# Patient Record
Sex: Female | Born: 1975 | Hispanic: Yes | Marital: Married | Smoking: Former smoker
Health system: Southern US, Community
[De-identification: ages and names within clinical notes are randomized; demographics above are authoritative.]

## PROBLEM LIST (undated history)

## (undated) DIAGNOSIS — F32A Depression, unspecified: Secondary | ICD-10-CM

## (undated) DIAGNOSIS — R519 Headache, unspecified: Secondary | ICD-10-CM

## (undated) DIAGNOSIS — F329 Major depressive disorder, single episode, unspecified: Secondary | ICD-10-CM

## (undated) DIAGNOSIS — R51 Headache: Secondary | ICD-10-CM

## (undated) DIAGNOSIS — D649 Anemia, unspecified: Secondary | ICD-10-CM

## (undated) DIAGNOSIS — I10 Essential (primary) hypertension: Secondary | ICD-10-CM

## (undated) DIAGNOSIS — N200 Calculus of kidney: Secondary | ICD-10-CM

## (undated) HISTORY — DX: Essential (primary) hypertension: I10

## (undated) HISTORY — PX: APPENDECTOMY: SHX54

## (undated) HISTORY — PX: OVARIAN CYST REMOVAL: SHX89

## (undated) HISTORY — DX: Calculus of kidney: N20.0

---

## 2008-08-01 ENCOUNTER — Ambulatory Visit: Payer: Self-pay | Admitting: Obstetrics & Gynecology

## 2008-10-04 ENCOUNTER — Ambulatory Visit: Payer: Self-pay | Admitting: Obstetrics & Gynecology

## 2008-10-04 ENCOUNTER — Encounter: Payer: Self-pay | Admitting: Obstetrics & Gynecology

## 2008-10-04 ENCOUNTER — Observation Stay (HOSPITAL_COMMUNITY): Admission: RE | Admit: 2008-10-04 | Discharge: 2008-10-05 | Payer: Self-pay | Admitting: Obstetrics & Gynecology

## 2008-11-15 ENCOUNTER — Ambulatory Visit: Payer: Self-pay | Admitting: Obstetrics and Gynecology

## 2010-04-10 ENCOUNTER — Ambulatory Visit: Payer: Self-pay | Admitting: Obstetrics & Gynecology

## 2010-04-28 ENCOUNTER — Ambulatory Visit (HOSPITAL_COMMUNITY)
Admission: RE | Admit: 2010-04-28 | Discharge: 2010-04-28 | Payer: Self-pay | Source: Home / Self Care | Admitting: Family Medicine

## 2010-05-01 ENCOUNTER — Ambulatory Visit: Payer: Self-pay | Admitting: Obstetrics and Gynecology

## 2010-06-14 ENCOUNTER — Encounter: Payer: Self-pay | Admitting: *Deleted

## 2010-07-31 ENCOUNTER — Ambulatory Visit: Payer: Self-pay | Admitting: Physician Assistant

## 2010-08-22 ENCOUNTER — Ambulatory Visit (INDEPENDENT_AMBULATORY_CARE_PROVIDER_SITE_OTHER): Payer: Self-pay | Admitting: Physician Assistant

## 2010-08-22 DIAGNOSIS — N83209 Unspecified ovarian cyst, unspecified side: Secondary | ICD-10-CM

## 2010-08-23 NOTE — Progress Notes (Signed)
Catherine Ingram, NATT          ACCOUNT NO.:  [id number]  MEDICAL RECORD NO.:  [id number]           PATIENT TYPE:  A  LOCATION:  [facility]                   FACILITY:  [facility]  PHYSICIAN:  Maylon Cos, CNM    DATE OF BIRTH:  [birth year]  DATE OF SERVICE:  08/22/2010                                 CLINIC NOTE  The patient returns today for a 42-month followup for resolution of a complex right ovarian cyst that has previously been treated with 2 months of oral contraceptive pills.  HISTORY OF PRESENT ILLNESS:  The patient was seen on May 01, 2010, by Dr. Okey Dupre.  She is 35 year old Spanish speaking Hispanic female who is a gravida 2, para 2-0-0-2 who is desiring more children.  She underwent bilateral cystectomy on Oct 04, 2008, by Dr. Marice Potter secondary to bilateral dermoid cyst.  Subsequently, she developed a complex right cyst on her right ovary that was probably hemorrhagic in nature and returns today for followup.  The patient states that she is having no pain and is having regular menstrual cycle.  She took 2 months of oral contraceptive pills, has had regular periods since discontinuing those, is actually trying to get pregnant and has still not conceived.  PHYSICAL EXAMINATION:  GENERAL:  Today, Senaida is a pleasant Hispanic female in no apparent distress. HEENT:  Grossly normal. ABDOMEN:  Soft and nontender with no hepatosplenomegaly. PELVIC:  Nonenlarged, nontender uterus with no cervical motion tenderness and nonenlarged, nontender adnexa bilaterally.  ASSESSMENT: 1. Resolution of ovarian cyst. 2. Secondary infertility.  PLAN: 1. I have discussed in brief today charting her periods and sexual     intercourse and following up with this information in our     infertility clinic, payment option and cost of these appointments     has been discussed with her. 2. Per Dr. Serita Kyle note at her earlier appointments, the possibility of     a hysterosalpingogram has also been  reviewed; however, the patient     was unaware of the cost of this, again that has been shared with     600 dollar cost and the patient is unsure of her interest in this     procedure.  Furthermore, semen analysis has not been done of her     partner, that is also recommended and cost of that has been     discussed as well. 3. Followup.  The patient should follow up as needed for pain or at     our next infertility clinic for workup.  The patient is agreement     with this plan.         ______________________________ Maylon Cos, CNM   SS/MEDQ  D:  08/22/2010  T:  08/23/2010  Job:  [id number]

## 2010-09-02 LAB — CBC
Hemoglobin: 11.7 g/dL — ABNORMAL LOW (ref 12.0–15.0)
MCHC: 34.6 g/dL (ref 30.0–36.0)
MCHC: 34.9 g/dL (ref 30.0–36.0)
MCV: 92.2 fL (ref 78.0–100.0)
MCV: 92.8 fL (ref 78.0–100.0)
Platelets: 200 10*3/uL (ref 150–400)
RBC: 3.6 MIL/uL — ABNORMAL LOW (ref 3.87–5.11)
RBC: 4.13 MIL/uL (ref 3.87–5.11)

## 2010-09-02 LAB — URINALYSIS, ROUTINE W REFLEX MICROSCOPIC
Glucose, UA: NEGATIVE mg/dL
Ketones, ur: NEGATIVE mg/dL
Leukocytes, UA: NEGATIVE
Protein, ur: NEGATIVE mg/dL

## 2010-09-02 LAB — URINE MICROSCOPIC-ADD ON

## 2010-10-07 NOTE — Op Note (Signed)
NAMETISHARA, Catherine Ingram          ACCOUNT NO.:  [id number]   MEDICAL RECORD NO.:  [id number]          PATIENT TYPE:  OBV   LOCATION:  9320                          [facility]   PHYSICIAN:  Catherine Bossier, MD        DATE OF BIRTH:  01/15/1976   DATE OF PROCEDURE:  10/04/2008  DATE OF DISCHARGE:                               OPERATIVE REPORT   PREOPERATIVE DIAGNOSIS:  Bilateral dermoid tumors.   POSTOPERATIVE DIAGNOSIS:  Bilateral dermoid tumors.   PROCEDURE:  Laparoscopy followed by exploratory laparotomy and bilateral  ovarian cystectomy.   SURGEON:  Catherine C. Marice Potter, MD   ASSISTANT:  Catherine Dukes, MD.   ANESTHESIA:  GETA.  Catherine Ingram, M.D.   COMPLICATIONS:  None.   ESTIMATED BLOOD LOSS:  Minimal.   SPECIMENS:  Bilateral ovarian tumors.   DETAILS OF PROCEDURE AND FINDINGS:  Preoperatively explained to Catherine Ingram  that I would try to remove the dermoid through the laparoscope.  She was  very much worried that she would lose both her ovaries entirely.  She  does want to have more children.  I explained to her that was a  possibility and that the other possibility was that I would have to do a  laparotomy to remove either the ovaries or the tumors.  The risks,  benefits, and alternatives of surgery were explained understood and  accepted.  Consents were signed.   DESCRIPTION OF PROCEDURE:  She was taken to the operating room and  general anesthesia was applied without complication after she was in the  dorsal lithotomy position.  Her abdomen was prepped and draped usual  sterile fashion.  A Foley catheter was placed, which drained clear urine  throughout.  A bimanual exam revealed a retroverted normal size and  shape uterus and bilateral adnexal masses.  A Hulka manipulator was  placed.  Gloves were changed.  Attention was turned to the abdomen.  A  vertical umbilical incision was made.  A Veress needle was placed  intraperitoneally, low-flow CO2 was used to insufflate  the abdomen,  approximately 5 L.  After a good pneumoperitoneum was established a 10-  mm trocar was placed.  Laparoscopy was performed.  She was placed in  Trendelenburg position.  The pelvis was inspected in alpha manner.  The  right ovary was approximately 8 cm in diameter and the left was about 4.  Based on the size of that, I decided to proceed with a laparotomy.  The  CO2 was allowed to escape from the abdomen and we proceeded to make a  small transverse incision approximately 1 cm above the symphysis pubis.  Incision was carried down through the subcutaneous tissue and the  fascia.  Fascia was scored in the midline.  Fascial incision was  extended bilaterally and curved slightly upwards.  The pyramidalis  muscles were separated in midline using electrosurgical technique and  the inner 25% of each of the rectus muscles were separated in transverse  fashion using electrosurgical technique.  The peritoneum was elevated  with hemostats and incised with curved Metzenbaum scissors.  The left  ovary  was taken out of the abdomen gently and the incision was lined  with moist green towels.  A nick was made in the cortex of the uterus  using a cautery and then the dermoid was shelled out.  Excellent  hemostasis was maintained throughout and there was no spillage.  The  remainder of the ovarian cortex was oversewn together in a running  locking fashion.  Excellent hemostasis was noted and it was wrapped in  Interceed to help prevent future adhesions.  The same procedure was  performed on the right side ovary.  Again minimal bleeding was noted.  The specimen was removed and noted to be intact and the remainder of the  ovarian cortex was closed with a running locking 2-0 Vicryl suture.  It  was wrapped in the Interceed and allowed to fall back in abdominal  cavity.  There was a minimal amount blood loss.  The rectus muscles were  inspected noted to be hemostatic.  The fascia was closed with the  0  Vicryl running nonlocking suture.  No defects were palpable.  The  subcutaneous tissue was irrigated, cleaned and dried.  It was then  infiltrated with 30 mL of 0.5% Marcaine.  A subcuticular closure was  done with 3-0 Vicryl suture.  Instruments, sponge, needle and counts  were correct and Foley catheter drained clear urine throughout.  She was  taken to recovery room in stable condition.      Catherine Bossier, MD  Electronically Signed     MCD/MEDQ  D:  10/04/2008  T:  10/05/2008  Job:  [id number]

## 2010-10-07 NOTE — Discharge Summary (Signed)
Catherine Ingram, Catherine Ingram          ACCOUNT NO.:  [id number]   MEDICAL RECORD NO.:  [id number]          PATIENT TYPE:  OBV   LOCATION:  9320                          [facility]   PHYSICIAN:  Allie Bossier, MD        DATE OF BIRTH:  [birth year]   DATE OF ADMISSION:  10/04/2008  DATE OF DISCHARGE:  10/05/2008                               DISCHARGE SUMMARY   ADMISSION DIAGNOSIS:  Bilateral dermoid cyst.   DISCHARGE DIAGNOSES:  1. Bilateral dermoid cyst.  2. Anemia.   PROCEDURE:  An uncomplicated laparoscopy followed by a laparotomy with  bilateral ovarian cystectomy.   CONDITION:  Stable.   DISPOSITION:  Home.   DIET:  As tolerated.   ACTIVITY:  She is not to drive while taking narcotics.  She is not to  have intercourse for 3 weeks and I have recommended that she can  postpone pregnancy for 3 months.   NEW DISCHARGE MEDICATIONS:  Percocet 325 mg/5 mg one p.o. q.4 h p.r.n.  pain #30 no refills.  Her home medication is lisinopril 25 mg daily.   FOLLOWUP:  She will follow up in 4-6 weeks or as necessary sooner at the  clinic.  She is given the number and will call.   BRIEF HISTORY OF PRESENT ILLNESS AND HOSPITAL COURSE:  Catherine Ingram is a 35-  year-old, married, Hispanic, gravida 2, para 2, who was diagnosed  bilateral dermoid cyst in November of last year via a CT scan.  She was  scheduled for surgery, but this is the first time that she did not have  her surgery until now.  On initial laparoscopy, the dermoid on the right  had grown to a size that would make it impossible to do the surgery  laparoscopically, therefore, I proceeded with a mini laparotomy and  removed both dermoids on each side and left ovarian tissue per her  request on each side.  She was hemostatic.  When we left, her postop  hemoglobin was 11.7  and her admission hemoglobin was 13.2.  On the day of discharge, she was  voiding, ambulating, tolerating p.o. without nausea, vomiting, or other  problems.  She was  ready to go home and all questions were answered.  She will follow up as above.       Allie Bossier, MD  Electronically Signed     MCD/MEDQ  D:  10/05/2008  T:  10/05/2008  Job:  [id number]

## 2010-10-07 NOTE — Group Therapy Note (Signed)
Catherine Ingram, Catherine Ingram          ACCOUNT NO.:  [id number]   MEDICAL RECORD NO.:  [id number]          PATIENT TYPE:  WOC   LOCATION:  [facility]                   [facility]AC[facility]  [facility]   PHYSICIAN:  Wynelle Bourgeois, CNM    DATE OF BIRTH:  11-Jun-1975   DATE OF SERVICE:  11/15/2008                                  CLINIC NOTE   SUBJECTIVE:  This is a 35 year old single Hispanic, gravida 2, para 2  who was referred from [facility] for bilateral ovarian  dermoid cysts.  These were removed by Dr. Nicholaus Bloom on Oct 04, 2008, by  an uncomplicated laparoscopy followed by a laparotomy with bilateral  ovarian cystectomy.  Pathology showed bilateral mature cystic teratomas  with no immature or malignant features on either one.  They have  appropriate features for dermoid cysts and no abnormal features.  She  was discharged home the next day and has done well at home.  Her last  pain medicine was about a week ago.  She has very little pain at  present.  She has had no abnormal bleeding or fevers.  She was passing  her urine and bowels normally.  She plans to continue her GYN care at  [facility] Department.   OBJECTIVE:  VITAL SIGNS:  Stable.  Blood pressure is 101/73, temperature  is 97.5, and weight is 159.2.  ABDOMEN:  Soft and nontender.  Low transverse incision is well healed  with Steri-Strips, which were removed.  Vaginal bleeding, none.  EXTREMITIES:  Within normal limits.   ASSESSMENT:  Status post laparotomy for excision of bilateral dermoid  cysts by Dr. Marice Potter 6 weeks ago.   PLAN:  Discharged from our care back to the [facility]  Department.  The patient declines contraception and wishes to attempt  pregnancy within the next few months.  She was advised to wait for 3  months before attempting pregnancy.  She is advised that she can resume  her normal activities unless she has pain with them in which case, she  should diminish her activity until she does not  have pain.  She plans to  have her Pap in December at [facility] Department and will  contact us only if needed.           ______________________________  Wynelle Bourgeois, CNM     MW/MEDQ  D:  11/15/2008  T:  11/16/2008  Job:  [id number]

## 2010-10-07 NOTE — Group Therapy Note (Signed)
NAMERENAYE, JANICKI NO.:  [id number]   MEDICAL RECORD NO.:  [id number]          PATIENT TYPE:  WOC   LOCATION:  [facility]                   [facility]   PHYSICIAN:  Allie Bossier, MD        DATE OF BIRTH:  1975/12/21   DATE OF SERVICE:                                  CLINIC NOTE   Ms. Flurry is a 35 year old single Hispanic gravida 2, para 2.  She  has 9 and 59-year-old daughters.  She was referred here from the [facility] Department after a CT scan done for right-sided pelvic pain  done on March 02, 2008, showed bilateral ovarian dermoid.  She had the  CT scan done initially for right lower quadrant pain and which showed a  5-mm kidney stone.  She actually passed a kidney stone and feels better.  However, follow up on the dermoids is needed, one measured 5.3 x 3.6 cm  on the left and the right one measured 5.6 x 6 cm.  She would like to  have another child sometime in the future.  She currently takes oral  contraceptive pills for birth control and has been very happy with this  method.   PAST MEDICAL HISTORY:  Hypertension, kidney stones, and bilateral  dermoids.   PAST SURGICAL HISTORY:  None.   FAMILY HISTORY:  Negative for breast, GYN, and colon malignancies.   SOCIAL HISTORY:  Negative for tobacco, alcohol, or drug use.   ALLERGIES:  No known drug allergies.  No latex allergies.   REVIEW OF SYSTEMS:  Her last Pap smear was done in December 2009 and it  was normal per her.  It was done at [facility].  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   MEDICATION LIST:  Lisinopril 25 mg daily or contraceptive pills (she  will call with the specific brand name), multivitamin daily, and  ibuprofen p.r.n.   PHYSICAL EXAMINATION:  VITAL SIGNS:  Weight 166 pounds, blood pressure  113/70, pulse 57, height 5 feet 6 inches, weight 166 pounds.  HEENT:  Normal.  HEART:  Regular rate and rhythm.  LUNGS:  Clear to auscultation bilaterally.  ABDOMEN:  Benign.  PELVIC:  There are palpable adnexal masses, minimally tender.   ASSESSMENT AND PLAN:  Bilateral adnexal masses consistent with dermoids.  I have had a lengthy discussion with the assistance of [place] as the  interpreter.  We have discussed that there is a low potential for  malignancy with these but that we do recommend surgical removal.  We  established that she would prefer a laparoscopic procedure over an open  procedure, and I am in agreement with this.  I have also discussed with  her that I will try to leave any unaffected ovarian tissue that I can  but it is possible that at the completion of surgery, her entire ovaries  on both sides may have been removed.  She understands this and she  understands that if this happens that means no more children from her  eggs.  Risks of surgery explained has been accepted.      Allie Bossier, MD     MCD/MEDQ  D:  08/01/2008  T:  08/02/2008  Job:  [id number]

## 2013-01-19 ENCOUNTER — Encounter: Payer: Self-pay | Admitting: Obstetrics & Gynecology

## 2013-01-19 ENCOUNTER — Ambulatory Visit (INDEPENDENT_AMBULATORY_CARE_PROVIDER_SITE_OTHER): Payer: Self-pay | Admitting: Obstetrics & Gynecology

## 2013-01-19 VITALS — BP 125/87 | HR 60 | Temp 97.8°F | Ht 66.0 in | Wt 176.7 lb

## 2013-01-19 DIAGNOSIS — N949 Unspecified condition associated with female genital organs and menstrual cycle: Secondary | ICD-10-CM

## 2013-01-19 DIAGNOSIS — R102 Pelvic and perineal pain: Secondary | ICD-10-CM

## 2013-01-19 DIAGNOSIS — Z86018 Personal history of other benign neoplasm: Secondary | ICD-10-CM | POA: Insufficient documentation

## 2013-01-19 LAB — POCT URINALYSIS DIP (DEVICE)
Bilirubin Urine: NEGATIVE
Nitrite: NEGATIVE
pH: 6 (ref 5.0–8.0)

## 2013-01-19 NOTE — Progress Notes (Signed)
Patient ID: Catherine Ingram, female   DOB: Mar 08, 1976, 37 y.o.   MRN: [id number]  Chief Complaint  Patient presents with  . ovarian cysts    patient wants to be checked for cysts  . Pelvic Pain    4 months; gets period sometimes 3 times in one month    HPI Catherine Ingram is a 37 y.o. female.  [id number] Patient's last menstrual period was 01/06/2013.   BTB on Micron[employer] Rx by HD in [facility] due to her HTN. Pelvic pain on right side for 3-4 months, dyspareunia HPI  Past Medical History  Diagnosis Date  . Hypertension   . Kidney stones     Past Surgical History  Procedure Laterality Date  . Ovarian cyst removal    laparotomy, bilateral 2010 Dr. Marice Potter  Family History  Problem Relation Age of Onset  . Hypertension Mother   . Diabetes Mother   . Hypertension Father   . Stroke Father     Social History History  Substance Use Topics  . Smoking status: Never Smoker   . Smokeless tobacco: Never Used  . Alcohol Use: No    No Known Allergies  Current Outpatient Prescriptions  Medication Sig Dispense Refill  . amLODipine (NORVASC) 10 MG tablet Take 10 mg by mouth daily.      . norethindrone (MICRONOR,CAMILA,ERRIN) 0.35 MG tablet Take 1 tablet by mouth daily.       No current facility-administered medications for this visit.    Review of Systems Review of Systems  Constitutional: Negative for fever.  Genitourinary: Positive for dysuria, frequency, menstrual problem, pelvic pain and dyspareunia. Negative for vaginal bleeding, vaginal discharge and vaginal pain.    Blood pressure 125/87, pulse 60, temperature 97.8 F (36.6 C), temperature source Oral, height 5\' 6"  (1.676 m), weight 176 lb 11.2 oz (80.151 kg), last menstrual period 01/06/2013.  Physical Exam Physical Exam  Nursing note and vitals reviewed. Constitutional: She is oriented to person, place, and time. She appears well-developed and well-nourished. No distress.  Pulmonary/Chest: Effort normal.   Abdominal: Soft. She exhibits no distension and no mass. There is no tenderness.  Genitourinary: Vagina normal and uterus normal. No vaginal discharge (GC and CT sent) found.  No mass, mild s/p and right side tenderness  Neurological: She is alert and oriented to person, place, and time.  Skin: Skin is warm and dry.  Psychiatric: She has a normal mood and affect. Her behavior is normal.    Data Reviewed Op report  Assessment    Pelvic pain, BTB     Plan    Loestrin 24 RTC check BP Pelvic US Urine culture        ARNOLD,JAMES 01/19/2013, 4:25 PM

## 2013-01-19 NOTE — Patient Instructions (Addendum)
Dolor pélvico en la mujer   (Pelvic Pain, Female)   Las causa del dolor pélvico en la mujer pueden ser muchas y pueden tener su origen en diferentes lugares. El dolor pélvico es el que aparece en la mitad inferior del abdomen y entre las caderas. Puede aparecer durante en un período corto de tiempo (agudo)o puede ser recurrente (crónico). Esta afección puede estar relacionada con trastornos que afectan a los órganos reproductivos femeninos (ginecológica), pero también puede deberse a problemas en la vejiga, cálculos renales, complicaciones intestinales, o problemas musculares o esqueléticos. Es importante solicitar ayuda de inmediato, sobre todo si ha sido intenso, agudo, o ha aparecido de manera súbita como un dolor inusual. También es importante obtener ayuda de inmediato, ya que algunos tipos de dolor pélvico puede poner en peligro la vida.   CAUSAS   A continuación veremos algunas de las causas del dolor pélvico. Las causas pueden clasificarse de diferentes modos.   · Ginecológica.  · Enfermedad inflamatoria pélvica.  · Infecciones de transmisión sexual.  · Quiste de ovario o torsión de un ligamento ovárico ( torsión ovárica).  · La membrana que recubre internamente al útero desarrollándose fuera del útero (endometriosis).  · Fibromas, quistes o tumores.  · Ovulación.  · Embarazo.  · Embarazo fuera del útero (embarazo ectópico).  · Aborto espontáneo.  · Trabajo de parto.  · Desprendimiento de la placenta o ruptura del útero.  · Infecciones.  · Infección uterina (endometritis).  · Infección de la vejiga.  · Diverticulitis.  · Aborto relacionado con una infección uterina (aborto séptico).  · Vejiga.  · Inflamación de la vejiga (cistitis).  · Cálculos renales.  · Gastrointenstinal.  · Estreñimiento.  · Diverticulitis.  · Neurológico.  · Traumatismos.  · Sentir dolor pélvico debido a causas mentales o emocionales (psicosomático).  · Tumores en el intestino o en la pelvis.  EVALUACIÓN   El médico hará una historia  clínica detallada según sus síntomas. Incluirá los cambios recientes en su salud, una cuidadosa historia ginecológica de sus periodos (menstruaciones) y una historia de su actividad sexual. Los antecedentes familiares y la historia clínica también son importantes. Su médico podrá indicar un examen pélvico. El examen pélvico ayudará a identificar la ubicación y la gravedad del dolor. También ayudará a evaluar los órganos que pueden estar involucrados. . Para identificar la causa del dolor pélvico y tratarlo adecuadamente, el médico puede indicar estudios. Estas pruebas pueden ser:   · Test de embarazo.  · Ecografía pélvica.  · Radiografía del abdomen.  · Un análisis de orina o la evaluación de la secreción vaginal.  · Análisis de sangre.  INSTRUCCIONES PARA EL CUIDADO EN EL HOGAR   · Solo tome medicamentos de venta libre o recetados para el dolor, malestar o fiebre, según las indicaciones del médico.    · Haga reposo según las indicaciones del médico.    · Consuma una dieta balanceada.    · Beba gran cantidad de líquido para mantener la orina de tono claro o amarillo pálido.    · Evite las relaciones sexuales, si le producen dolor.    · Aplique compresas calientes o frías en la zona baja del abdomen según cual le calme el dolor.    · Evite las situaciones estresantes.    · Lleve un registro del dolor pélvico. Anote cuándo comenzó, dónde se localiza el dolor y si hay cosas que parecen estar asociadas con el dolor, como algún alimento o su ciclo menstrual.  · Concurra a las visitas de control con el médico, según   las indicaciones.    SOLICITE ATENCIÓN MÉDICA SI:   · Los medicamentos no le calman el dolor.  · Tiene flujo vaginal anormal.  SOLICITE ATENCIÓN MÉDICA DE INMEDIATO SI:   · Tiene un sangrado abundante por la vagina.    · El dolor pélvico aumenta.    · Se siente mareada o sufre un desmayo.    · Siente escalofríos.    · Siente dolor intenso al orinar u observa sangre en la orina.    · Tiene diarrea o vómitos que  no puede controlar.    · Tiene fiebre o síntomas que persisten durante más de 3 días.  · Tiene fiebre y los síntomas empeoran.    · Ha sido abusada física o sexualmente.    ASEGÚRESE DE QUE:   · Comprende estas instrucciones.  · Controlará su enfermedad.  · Solicitará ayuda de inmediato si no mejora o si empeora.  Document Released: 08/07/2008 Document Revised: 11/10/2011  Exit[employer]® Patient Information ©2014 [employer].

## 2013-01-21 LAB — URINE CULTURE

## 2013-01-23 HISTORY — PX: WISDOM TOOTH EXTRACTION: SHX21

## 2013-01-24 ENCOUNTER — Ambulatory Visit (HOSPITAL_COMMUNITY)
Admission: RE | Admit: 2013-01-24 | Discharge: 2013-01-24 | Disposition: A | Discharge status: Home / Self Care | Payer: No Typology Code available for payment source | Source: Ambulatory Visit | Attending: Obstetrics & Gynecology | Admitting: Obstetrics & Gynecology

## 2013-01-24 DIAGNOSIS — R102 Pelvic and perineal pain: Secondary | ICD-10-CM

## 2013-01-24 DIAGNOSIS — N949 Unspecified condition associated with female genital organs and menstrual cycle: Secondary | ICD-10-CM | POA: Insufficient documentation

## 2013-01-24 DIAGNOSIS — D251 Intramural leiomyoma of uterus: Secondary | ICD-10-CM | POA: Insufficient documentation

## 2013-01-26 ENCOUNTER — Telehealth: Payer: Self-pay | Admitting: General Practice

## 2013-01-26 DIAGNOSIS — N390 Urinary tract infection, site not specified: Secondary | ICD-10-CM

## 2013-01-26 MED ORDER — NITROFURANTOIN MONOHYD MACRO 100 MG PO CAPS: 100.0000 mg | ORAL_CAPSULE | Freq: Two times a day (BID) | ORAL | Status: DC

## 2013-01-26 NOTE — Telephone Encounter (Signed)
Message copied by Kathee Delton on Thu Jan 26, 2013 11:31 AM ------      Message from: Adam Phenix      Created: Thu Jan 26, 2013 11:21 AM       UTI, Rx Macrobid 100 mg BID 7 days ------

## 2013-01-26 NOTE — Telephone Encounter (Signed)
Called patient with Catherine Ingram for interpreter and informed her of results and medication available for pickup at her [facility] pharmacy. Patient verbalized understanding and had no further questions

## 2013-03-03 ENCOUNTER — Encounter: Payer: Self-pay | Admitting: Obstetrics & Gynecology

## 2013-03-03 ENCOUNTER — Ambulatory Visit (INDEPENDENT_AMBULATORY_CARE_PROVIDER_SITE_OTHER): Payer: No Typology Code available for payment source | Admitting: Obstetrics & Gynecology

## 2013-03-03 ENCOUNTER — Encounter: Payer: Self-pay | Admitting: *Deleted

## 2013-03-03 VITALS — BP 138/86 | HR 59 | Temp 99.5°F | Ht 65.0 in | Wt 174.4 lb

## 2013-03-03 DIAGNOSIS — R102 Pelvic and perineal pain: Secondary | ICD-10-CM

## 2013-03-03 DIAGNOSIS — Z23 Encounter for immunization: Secondary | ICD-10-CM

## 2013-03-03 DIAGNOSIS — N938 Other specified abnormal uterine and vaginal bleeding: Secondary | ICD-10-CM

## 2013-03-03 DIAGNOSIS — N949 Unspecified condition associated with female genital organs and menstrual cycle: Secondary | ICD-10-CM

## 2013-03-03 DIAGNOSIS — Z0000 Encounter for general adult medical examination without abnormal findings: Secondary | ICD-10-CM

## 2013-03-03 LAB — LIPID PANEL
Cholesterol: 197 mg/dL (ref 0–200)
Total CHOL/HDL Ratio: 2.8 Ratio

## 2013-03-03 LAB — CBC
Hemoglobin: 13.3 g/dL (ref 12.0–15.0)
RBC: 4.35 MIL/uL (ref 3.87–5.11)
WBC: 7.2 10*3/uL (ref 4.0–10.5)

## 2013-03-03 LAB — COMPREHENSIVE METABOLIC PANEL
Albumin: 4.3 g/dL (ref 3.5–5.2)
BUN: 11 mg/dL (ref 6–23)
Calcium: 9.3 mg/dL (ref 8.4–10.5)
Chloride: 104 mEq/L (ref 96–112)
Glucose, Bld: 88 mg/dL (ref 70–99)
Potassium: 4.4 mEq/L (ref 3.5–5.3)

## 2013-03-03 LAB — POCT URINALYSIS DIP (DEVICE)
Bilirubin Urine: NEGATIVE
Glucose, UA: NEGATIVE mg/dL
Specific Gravity, Urine: 1.015 (ref 1.005–1.030)

## 2013-03-03 LAB — TSH: TSH: 1.677 u[IU]/mL (ref 0.350–4.500)

## 2013-03-03 MED ORDER — NORGESTREL-ETHINYL ESTRADIOL 0.3-30 MG-MCG PO TABS: 1.0000 | ORAL_TABLET | Freq: Every day | ORAL | Status: DC

## 2013-03-03 NOTE — Progress Notes (Signed)
Patient describes ovarian pain yesterday but denies pain today.  BP elevated. Pt reports she has not taken her medication today because she has not eaten today.

## 2013-03-03 NOTE — Patient Instructions (Signed)

## 2013-03-03 NOTE — Progress Notes (Signed)
  Subjective:    Patient ID: Catherine Ingram, female    DOB: Jul 22, 1975, 37 y.o.   MRN: [id number]  HPI  37 yo SH P 2 (14 and9) here today in the gyn clinic to discuss her u/s result. She had the u/s done to evaluate pain in her pelvis, right greater than left. No relation to her period, worse with sex. Happens irregularly, about 3 times per week, lasts for hours, but it is mild. She has tried no medications for this pain. She takes OCPs but she has some irregular bleeding.  Review of Systems Last pap 2011    Objective:   Physical Exam        Assessment & Plan:  Irregular bleeding- check TSH and cervical cultures Pelvic pain- IBU prescription given Change her OCPs to Lo/Ovral RTC 2 months for follow up and annual I will check screening labs today Flu vaccine today

## 2013-03-04 LAB — GC/CHLAMYDIA PROBE AMP: GC Probe RNA: NEGATIVE

## 2013-05-01 ENCOUNTER — Ambulatory Visit: Payer: No Typology Code available for payment source | Admitting: Obstetrics & Gynecology

## 2013-06-21 ENCOUNTER — Encounter: Payer: Self-pay | Admitting: Obstetrics & Gynecology

## 2013-06-21 ENCOUNTER — Ambulatory Visit (INDEPENDENT_AMBULATORY_CARE_PROVIDER_SITE_OTHER): Payer: No Typology Code available for payment source | Admitting: Obstetrics & Gynecology

## 2013-06-21 VITALS — BP 131/86 | HR 61 | Temp 98.1°F | Resp 20 | Ht 66.0 in | Wt 180.9 lb

## 2013-06-21 DIAGNOSIS — Z9889 Other specified postprocedural states: Secondary | ICD-10-CM

## 2013-06-21 DIAGNOSIS — Z0000 Encounter for general adult medical examination without abnormal findings: Secondary | ICD-10-CM

## 2013-06-21 DIAGNOSIS — Z01419 Encounter for gynecological examination (general) (routine) without abnormal findings: Secondary | ICD-10-CM

## 2013-06-21 DIAGNOSIS — Z86018 Personal history of other benign neoplasm: Secondary | ICD-10-CM

## 2013-06-21 NOTE — Progress Notes (Signed)
Subjective:    Catherine Ingram is a 38 y.o. female who presents for an annual exam. The patient has no complaints today except for her occasional "colicky" pelvic pain. The patient is sexually active. GYN screening history: last pap: was normal. The patient wears seatbelts: yes. The patient participates in regular exercise: yes. Has the patient ever been transfused or tattooed?: no. The patient reports that there is not domestic violence in her life.   Menstrual History: OB History   Grav Para Term Preterm Abortions TAB SAB Ect Mult Living   2 2 2  0 0 0 0 0 0 2      Menarche age: 2 Coitarche: 54  Patient's last menstrual period was 06/08/2013.    The following portions of the patient's history were reviewed and updated as appropriate: allergies, current medications, past family history, past medical history, past social history, past surgical history and problem list.  Review of Systems A comprehensive review of systems was negative. 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   Objective:    BP 131/86  Pulse 61  Temp(Src) 98.1 F (36.7 C) (Oral)  Resp 20  Ht 5\' 6"  (1.676 m)  Wt 180 lb 14.4 oz (82.056 kg)  BMI 29.21 kg/m2  LMP 06/08/2013  General Appearance:    Alert, cooperative, no distress, appears stated age  Head:    Normocephalic, without obvious abnormality, atraumatic  Eyes:    PERRL, conjunctiva/corneas clear, EOM's intact, fundi    benign, both eyes  Ears:    Normal TM's and external ear canals, both ears  Nose:   Nares normal, septum midline, mucosa normal, no drainage    or sinus tenderness  Throat:   Lips, mucosa, and tongue normal; teeth and gums normal  Neck:   Supple, symmetrical, trachea midline, no adenopathy;    thyroid:  no enlargement/tenderness/nodules; no carotid   bruit or JVD  Back:     Symmetric, no curvature, ROM normal, no CVA tenderness  Lungs:     Clear to auscultation bilaterally, respirations unlabored  Chest Wall:    No  tenderness or deformity   Heart:    Regular rate and rhythm, S1 and S2 normal, no murmur, rub   or gallop  Breast Exam:    No tenderness, masses, or nipple abnormality  Abdomen:     Soft, non-tender, bowel sounds active all four quadrants,    no masses, no organomegaly  Genitalia:    Normal female without lesion, discharge or tenderness, NSSA, NT mobile, normal adnexal exam     Extremities:   Extremities normal, atraumatic, no cyanosis or edema  Pulses:   2+ and symmetric all extremities  Skin:   Skin color, texture, turgor normal, no rashes or lesions  Lymph nodes:   Cervical, supraclavicular, and axillary nodes normal  Neurologic:   CNII-XII intact, normal strength, sensation and reflexes    throughout  .    Assessment:    Healthy female exam.  She requests STI testing as she is not sure if her boyfriend is monogamous   Plan:     Breast self exam technique reviewed and patient encouraged to perform self-exam monthly. Thin prep Pap smear. with cotesting

## 2013-06-21 NOTE — Progress Notes (Signed)
Pt states she did not know that an alternate Rx for OCP was sent to her pharmacy at last visit in October 2014. She has been taking Micronor which she has been receiving from the [facility] but states this is the last pack.  Pt has questions today about a "tumor on my uterus".

## 2013-06-22 LAB — HEPATITIS B SURFACE ANTIGEN: Hepatitis B Surface Ag: NEGATIVE

## 2013-06-22 LAB — HEPATITIS C ANTIBODY: HCV Ab: NEGATIVE

## 2013-06-22 LAB — RPR

## 2013-06-22 LAB — HIV ANTIBODY (ROUTINE TESTING W REFLEX): HIV: NONREACTIVE

## 2013-12-15 ENCOUNTER — Other Ambulatory Visit: Payer: Self-pay | Admitting: Obstetrics and Gynecology

## 2013-12-15 DIAGNOSIS — N938 Other specified abnormal uterine and vaginal bleeding: Secondary | ICD-10-CM

## 2013-12-15 DIAGNOSIS — Z0000 Encounter for general adult medical examination without abnormal findings: Secondary | ICD-10-CM

## 2013-12-15 DIAGNOSIS — R102 Pelvic and perineal pain: Secondary | ICD-10-CM

## 2013-12-15 MED ORDER — NORGESTIM-ETH ESTRAD TRIPHASIC 0.18/0.215/0.25 MG-35 MCG PO TABS: 1.0000 | ORAL_TABLET | Freq: Every day | ORAL | Status: DC

## 2013-12-15 NOTE — Progress Notes (Signed)
Pt has been receiving tri sprintec, instead of cryselle, and pt is doing well, so pt rx changed to match Rx that pt is satified with.

## 2014-03-26 ENCOUNTER — Encounter: Payer: Self-pay | Admitting: Obstetrics & Gynecology

## 2014-06-01 ENCOUNTER — Encounter: Payer: Self-pay | Admitting: Obstetrics & Gynecology

## 2014-06-01 ENCOUNTER — Ambulatory Visit (INDEPENDENT_AMBULATORY_CARE_PROVIDER_SITE_OTHER): Payer: No Typology Code available for payment source | Admitting: Obstetrics & Gynecology

## 2014-06-01 VITALS — BP 138/75 | HR 57 | Temp 98.1°F | Wt 182.2 lb

## 2014-06-01 DIAGNOSIS — R102 Pelvic and perineal pain: Secondary | ICD-10-CM

## 2014-06-01 NOTE — Progress Notes (Signed)
   Subjective:    Patient ID: Catherine Ingram, female    DOB: 03-Oct-1975, 39 y.o.   MRN: [id number]  HPI 39 yo MH P2 is here with the complaint of "colicky" pelvic pain. It was present at her last exam here 1 year ago. She also reports some dyspareunia. She has monthly periods and some cramping with her periods. The pain lasts about 5 minutes, 1-2 times per week, and spontaneously resolves.  She is concerned that she is not fertile. She stopped her OCPs 5 months ago and wants a pregnancy. She stopped taking MVIs about a month ago.    Review of Systems     Objective:   Physical Exam  Heart-rrr Lungs- CTAB Abd- benign EG, vagina, cervix normal Uterus 6 week size, mobile, NT, adnexa normal       Assessment & Plan:  Desire for pregnancy- reassurance given. Rec start MVIs daily. She was told that the average couple takes a year to conceive. She was also told that we do not do infertility work in this clinic.  Intermittent pelvic pain- She desires an u/s so this will be ordered.

## 2014-06-01 NOTE — Progress Notes (Signed)
Patient here today for annual exam. C/o of right sided pelvic pain with periods-- wants to see if she still has cyst and fibroids that she had in past. Also states she is trying to get pregnant-- stopped birth control 5 months ago.

## 2014-06-07 ENCOUNTER — Ambulatory Visit (HOSPITAL_COMMUNITY)
Admission: RE | Admit: 2014-06-07 | Discharge: 2014-06-07 | Disposition: A | Discharge status: Home / Self Care | Payer: Self-pay | Source: Ambulatory Visit | Attending: Obstetrics & Gynecology | Admitting: Obstetrics & Gynecology

## 2014-06-07 DIAGNOSIS — R102 Pelvic and perineal pain: Secondary | ICD-10-CM | POA: Insufficient documentation

## 2014-06-07 DIAGNOSIS — R1031 Right lower quadrant pain: Secondary | ICD-10-CM | POA: Insufficient documentation

## 2014-06-29 ENCOUNTER — Encounter: Payer: Self-pay | Admitting: Obstetrics and Gynecology

## 2014-06-29 ENCOUNTER — Ambulatory Visit (INDEPENDENT_AMBULATORY_CARE_PROVIDER_SITE_OTHER): Payer: Self-pay | Admitting: Obstetrics and Gynecology

## 2014-06-29 VITALS — BP 134/97 | HR 64 | Temp 97.9°F | Ht 66.0 in | Wt 183.0 lb

## 2014-06-29 DIAGNOSIS — N83201 Unspecified ovarian cyst, right side: Secondary | ICD-10-CM

## 2014-06-29 DIAGNOSIS — N8320 Unspecified ovarian cysts: Secondary | ICD-10-CM

## 2014-06-29 NOTE — Progress Notes (Signed)
Patient ID: Catherine Ingram, female   DOB: [birth year], 39 y.o.   MRN: [id number] 39 yo G2P2 presenting today for follow up on ED visit on 06/07/2014 which found a 2 cm right ovarian cyst. Patient reports that her pain has improved but she occasionally has a mild RLQ pain.  Past Medical History  Diagnosis Date  . Hypertension   . Kidney stones    Past Surgical History  Procedure Laterality Date  . Ovarian cyst removal    . Wisdom tooth extraction  01/2013   Family History  Problem Relation Age of Onset  . Hypertension Mother   . Diabetes Mother   . Hypertension Father   . Stroke Father    History  Substance Use Topics  . Smoking status: Former Smoker    Types: Cigarettes    Quit date: 06/25/1997  . Smokeless tobacco: Never Used  . Alcohol Use: No    GENERAL: Well-developed, well-nourished female in no acute distress.  ABDOMEN: Soft, nontender, nondistended.  PELVIC: Normal external female genitalia. Vagina is pink and rugated.  Normal discharge. Normal appearing cervix. Uterus is normal in size. No adnexal mass or tenderness. EXTREMITIES: No cyanosis, clubbing, or edema, 2+ distal pulses.   FINDINGS: Uterus  Measurements: 9.8 x 6.4 x 6.9 cm. There is a single intramural fundal fibroid which measures 3.3 x 3.3 x 3.7 cm, increased in size from prior.  Endometrium  Thickness: 9 mm. No focal abnormality visualized.  Right ovary  Measurements: 2.7 x 2.2 x 2.0 cm. Within the right ovary there is a 2 cm cystic mass which may represent 2 adjacent prominent follicles versus cyst with internal septation.  Left ovary  Measurements: 1.8 x 1.2 x 1.5 cm. Normal appearance/no adnexal mass.  Other findings  No free fluid.  IMPRESSION: Within the right ovary there are either 2 adjacent prominent follicles or potentially a cystic mass with thin internal septation. Recommend follow-up ultrasound in 6-8 weeks to evaluate for  interval change/resolution.  Interval increase in size of anterior fundal fibroid.   Electronically Signed  By: Lovey Newcomer M.D.  On: 06/07/2014 13:08  A/P 39 yo with a 2 cm right ovarian cyst - Reassurance provided - Will schedule follow up ultrasound at the end of February - Patient desires pregnancy. Discussed timing of intercourse with ovulation - Patient will be contacted with results of follow up ultrasound - RTC prn

## 2014-07-03 ENCOUNTER — Ambulatory Visit (HOSPITAL_COMMUNITY): Payer: Self-pay

## 2014-07-23 ENCOUNTER — Ambulatory Visit (HOSPITAL_COMMUNITY)
Admission: RE | Admit: 2014-07-23 | Discharge: 2014-07-23 | Disposition: A | Discharge status: Home / Self Care | Payer: Self-pay | Source: Ambulatory Visit | Attending: Obstetrics and Gynecology | Admitting: Obstetrics and Gynecology

## 2014-07-23 DIAGNOSIS — N8320 Unspecified ovarian cysts: Secondary | ICD-10-CM | POA: Insufficient documentation

## 2014-07-23 DIAGNOSIS — D259 Leiomyoma of uterus, unspecified: Secondary | ICD-10-CM | POA: Insufficient documentation

## 2014-07-23 DIAGNOSIS — N83201 Unspecified ovarian cyst, right side: Secondary | ICD-10-CM

## 2014-07-25 ENCOUNTER — Telehealth: Payer: Self-pay | Admitting: *Deleted

## 2014-07-25 NOTE — Telephone Encounter (Signed)
-----   Message from Mora Bellman, MD sent at 07/25/2014 12:51 PM EST ----- Please inform patient of complete resolution of ovarian cyst  Thanks  Vickii Chafe

## 2014-07-25 NOTE — Telephone Encounter (Signed)
Called patient with [employer] Spanish interpreter number [phone]. Patient informed of results.

## 2014-10-02 ENCOUNTER — Telehealth: Payer: Self-pay | Admitting: *Deleted

## 2014-10-02 NOTE — Telephone Encounter (Signed)
Patient left message in spanish about pills.

## 2014-10-04 NOTE — Telephone Encounter (Signed)
Called patient with spanish interpreter Alviris Almonte. Reports she thinks she has an infection and reports she bought cream OTC that is not helping-- reports thin yellow vaginal discharge with itching, denies odor. Advised patient come in tomorrow at 0930 for wet prep. Patient verbalized understanding and gratitude. No further questions or concerns.

## 2014-10-05 ENCOUNTER — Other Ambulatory Visit: Payer: Self-pay | Admitting: Family Medicine

## 2014-10-05 ENCOUNTER — Ambulatory Visit (INDEPENDENT_AMBULATORY_CARE_PROVIDER_SITE_OTHER): Payer: Self-pay | Admitting: Family Medicine

## 2014-10-05 ENCOUNTER — Encounter: Payer: Self-pay | Admitting: Family Medicine

## 2014-10-05 VITALS — BP 139/91 | HR 52 | Temp 98.3°F | Ht 66.0 in | Wt 177.9 lb

## 2014-10-05 DIAGNOSIS — N898 Other specified noninflammatory disorders of vagina: Secondary | ICD-10-CM

## 2014-10-05 NOTE — Progress Notes (Signed)
   Subjective:    Patient ID: Catherine Ingram, female    DOB: 06/09/1975, 39 y.o.   MRN: [id number]  HPI 4 days of vaginal discharge and itching.  White discharge with clumps.  Tried monistat without improvement.   Review of Systems  Constitutional: Negative for fever, chills and fatigue.  Gastrointestinal: Negative for nausea, vomiting, abdominal pain and diarrhea.  Genitourinary: Negative for dysuria, vaginal bleeding and vaginal pain.       Objective:   Physical Exam  Constitutional: She is oriented to person, place, and time. She appears well-developed and well-nourished.  Abdominal: Soft. Bowel sounds are normal. She exhibits no distension and no mass. There is no tenderness. There is no rebound and no guarding.  Genitourinary: No labial fusion. There is no rash, tenderness, lesion or injury on the right labia. There is no rash, tenderness, lesion or injury on the left labia. Cervix exhibits discharge (minimal white discharge). Cervix exhibits no friability. No erythema, tenderness or bleeding in the vagina. No foreign body around the vagina. No signs of injury around the vagina. No vaginal discharge found.  Neurological: She is alert and oriented to person, place, and time.  Skin: Skin is warm and dry. No rash noted. No erythema. No pallor.  Psychiatric: She has a normal mood and affect. Her behavior is normal. Judgment and thought content normal.      Assessment & Plan:   Problem List Items Addressed This Visit    None    Visit Diagnoses    Vaginal discharge    -  Primary    Relevant Orders    Wet prep, genital    GC/Chlamydia Probe Amp      Will call pt with results of vaginal cultures.

## 2014-10-06 ENCOUNTER — Other Ambulatory Visit: Payer: Self-pay | Admitting: Family Medicine

## 2014-10-06 LAB — WET PREP, GENITAL
TRICH WET PREP: NONE SEEN
Yeast Wet Prep HPF POC: NONE SEEN

## 2014-10-06 LAB — GC/CHLAMYDIA PROBE AMP
CT Probe RNA: NEGATIVE
GC PROBE AMP APTIMA: NEGATIVE

## 2014-10-06 MED ORDER — METRONIDAZOLE 500 MG PO TABS: 500.0000 mg | ORAL_TABLET | Freq: Two times a day (BID) | ORAL | Status: DC

## 2014-10-06 NOTE — Addendum Note (Signed)
Addended by: Truett Mainland on: 10/06/2014 03:14 PM   Modules accepted: Orders

## 2015-02-18 ENCOUNTER — Ambulatory Visit: Payer: Self-pay | Admitting: Obstetrics & Gynecology

## 2015-02-27 ENCOUNTER — Encounter: Payer: Self-pay | Admitting: Obstetrics & Gynecology

## 2015-02-27 ENCOUNTER — Ambulatory Visit (INDEPENDENT_AMBULATORY_CARE_PROVIDER_SITE_OTHER): Payer: Self-pay | Admitting: Obstetrics & Gynecology

## 2015-02-27 VITALS — BP 135/88 | HR 49 | Temp 98.3°F | Wt 179.0 lb

## 2015-02-27 DIAGNOSIS — N898 Other specified noninflammatory disorders of vagina: Secondary | ICD-10-CM

## 2015-02-27 DIAGNOSIS — Z0000 Encounter for general adult medical examination without abnormal findings: Secondary | ICD-10-CM

## 2015-02-27 DIAGNOSIS — N938 Other specified abnormal uterine and vaginal bleeding: Secondary | ICD-10-CM

## 2015-02-27 LAB — CBC
HCT: 39.9 % (ref 36.0–46.0)
HEMOGLOBIN: 13.1 g/dL (ref 12.0–15.0)
MCH: 30.2 pg (ref 26.0–34.0)
MCHC: 32.8 g/dL (ref 30.0–36.0)
MCV: 91.9 fL (ref 78.0–100.0)
MPV: 10.3 fL (ref 8.6–12.4)
Platelets: 219 10*3/uL (ref 150–400)
RBC: 4.34 MIL/uL (ref 3.87–5.11)
RDW: 13.2 % (ref 11.5–15.5)
WBC: 7.5 10*3/uL (ref 4.0–10.5)

## 2015-02-27 LAB — TSH: TSH: 1.574 u[IU]/mL (ref 0.350–4.500)

## 2015-02-27 MED ORDER — METRONIDAZOLE 500 MG PO TABS: 500.0000 mg | ORAL_TABLET | Freq: Two times a day (BID) | ORAL | Status: DC

## 2015-02-27 NOTE — Progress Notes (Signed)
   Subjective:    Patient ID: Catherine Ingram, female    DOB: May 30, 1975, 39 y.o.   MRN: [id number]  HPI 39 yo SH P2 (42 and 5 yo kids) here today for several reasons.  1) her pelvic pain has returned about 3 months ago. She had BL dermoids removed in 2010. She uses IBU with some help. Some dyspareunia  2) She also complains of about 2-3 periods per month for the last 3 months.  3) She has been trying to get pregnant with the FOB of her other kids for the last year.  4) She also has a vaginal discharge, white. She was treated for BV 5/15. She thinks this is the same.   Review of Systems     Objective:   Physical Exam WNWHHFNAD (interpretor present) Breathing, conversing, and ambulating normally Abd- benign Discharge c/w BV Bimanual-10 week size, NT, mobile, normal adnexal exam       Assessment & Plan:  DUB/pelvic pain- check gyn u/s, cbc, tsh, FSH Vaginal discharge- check wet prep, prescription for flagyl Declines Flu vaccine today

## 2015-02-28 LAB — WET PREP, GENITAL
Clue Cells Wet Prep HPF POC: NONE SEEN
Trich, Wet Prep: NONE SEEN
YEAST WET PREP: NONE SEEN

## 2015-02-28 LAB — FOLLICLE STIMULATING HORMONE: FSH: 2.6 m[IU]/mL

## 2015-03-05 ENCOUNTER — Ambulatory Visit (HOSPITAL_COMMUNITY)
Admission: RE | Admit: 2015-03-05 | Discharge: 2015-03-05 | Disposition: A | Discharge status: Home / Self Care | Payer: Self-pay | Source: Ambulatory Visit | Attending: Obstetrics & Gynecology | Admitting: Obstetrics & Gynecology

## 2015-03-05 DIAGNOSIS — N938 Other specified abnormal uterine and vaginal bleeding: Secondary | ICD-10-CM | POA: Insufficient documentation

## 2015-03-05 DIAGNOSIS — R102 Pelvic and perineal pain: Secondary | ICD-10-CM | POA: Insufficient documentation

## 2015-03-05 DIAGNOSIS — D251 Intramural leiomyoma of uterus: Secondary | ICD-10-CM | POA: Insufficient documentation

## 2015-03-29 ENCOUNTER — Encounter: Payer: Self-pay | Admitting: Obstetrics & Gynecology

## 2015-03-29 ENCOUNTER — Ambulatory Visit (INDEPENDENT_AMBULATORY_CARE_PROVIDER_SITE_OTHER): Payer: Self-pay | Admitting: Obstetrics & Gynecology

## 2015-03-29 VITALS — BP 158/96 | HR 54 | Temp 98.2°F | Ht 66.0 in | Wt 172.2 lb

## 2015-03-29 DIAGNOSIS — N898 Other specified noninflammatory disorders of vagina: Secondary | ICD-10-CM | POA: Insufficient documentation

## 2015-03-29 DIAGNOSIS — D251 Intramural leiomyoma of uterus: Secondary | ICD-10-CM

## 2015-03-29 DIAGNOSIS — R102 Pelvic and perineal pain: Secondary | ICD-10-CM | POA: Insufficient documentation

## 2015-03-29 MED ORDER — HYLAFEM VA SUPP: 1.0000 | Freq: Every day | VAGINAL | Status: DC

## 2015-03-29 NOTE — Progress Notes (Signed)
   Subjective:    Patient ID: Catherine Ingram, female    DOB: December 27, 1975, 39 y.o.   MRN: [id number]  HPI  39 yo SHP2 here for follow up for her DUB. Her hbg, tsh were normal. Her u/s showed a 4.9 cm anterior fundal fibroid. She has been trying to get pregnant with her long term partner.  She thinks that her BV is still present.  Review of Systems     Objective:   Physical Exam WNWHHFNAD Breathing, conversing, ambulating normally Abd- benign      Assessment & Plan:  DUB/pelvic pain-probably due to her fibroid. I have offered her depo provera, Mirena, myomectomy. She will consider her options and let me know  BV- Hylafem

## 2015-04-04 ENCOUNTER — Encounter (HOSPITAL_COMMUNITY): Payer: Self-pay | Admitting: *Deleted

## 2015-05-28 ENCOUNTER — Encounter (HOSPITAL_COMMUNITY)
Admission: RE | Admit: 2015-05-28 | Discharge: 2015-05-28 | Disposition: A | Discharge status: Home / Self Care | Payer: Self-pay | Source: Ambulatory Visit | Attending: Obstetrics & Gynecology | Admitting: Obstetrics & Gynecology

## 2015-05-28 ENCOUNTER — Encounter (HOSPITAL_COMMUNITY): Payer: Self-pay

## 2015-05-28 ENCOUNTER — Other Ambulatory Visit: Payer: Self-pay

## 2015-05-28 DIAGNOSIS — Z01818 Encounter for other preprocedural examination: Secondary | ICD-10-CM | POA: Insufficient documentation

## 2015-05-28 DIAGNOSIS — N938 Other specified abnormal uterine and vaginal bleeding: Secondary | ICD-10-CM | POA: Insufficient documentation

## 2015-05-28 DIAGNOSIS — D259 Leiomyoma of uterus, unspecified: Secondary | ICD-10-CM | POA: Insufficient documentation

## 2015-05-28 HISTORY — DX: Headache, unspecified: R51.9

## 2015-05-28 HISTORY — DX: Headache: R51

## 2015-05-28 LAB — BASIC METABOLIC PANEL
ANION GAP: 7 (ref 5–15)
BUN: 20 mg/dL (ref 6–20)
CHLORIDE: 102 mmol/L (ref 101–111)
CO2: 31 mmol/L (ref 22–32)
Calcium: 9.5 mg/dL (ref 8.9–10.3)
Creatinine, Ser: 0.72 mg/dL (ref 0.44–1.00)
GFR calc Af Amer: >60 mL/min (ref >60)
GFR calc non Af Amer: >60 mL/min (ref >60)
Glucose, Bld: 91 mg/dL (ref 65–99)
POTASSIUM: 3.9 mmol/L (ref 3.5–5.1)
Sodium: 140 mmol/L (ref 135–145)

## 2015-05-28 LAB — CBC
HEMATOCRIT: 40.6 % (ref 36.0–46.0)
Hemoglobin: 13.6 g/dL (ref 12.0–15.0)
MCH: 31.1 pg (ref 26.0–34.0)
MCHC: 33.5 g/dL (ref 30.0–36.0)
MCV: 92.9 fL (ref 78.0–100.0)
Platelets: 214 10*3/uL (ref 150–400)
RBC: 4.37 MIL/uL (ref 3.87–5.11)
RDW: 13 % (ref 11.5–15.5)
WBC: 7.7 10*3/uL (ref 4.0–10.5)

## 2015-05-28 LAB — TYPE AND SCREEN
ABO/RH(D): O POS
Antibody Screen: NEGATIVE

## 2015-05-28 LAB — ABO/RH: ABO/RH(D): O POS

## 2015-05-28 NOTE — Patient Instructions (Addendum)
   Your procedure is scheduled on: January 10   Enter through the Main Entrance of [facility] at:  [place] up the phone at the desk and dial [phone] and inform us of your arrival.  Please call this number if you have any problems the morning of surgery: [phone]  Remember: Do not eat or drink after midnight January 9 (monday)  Take these medicines the morning of surgery with a SIP OF WATER: take blood pressure meds as you normally take them Do not wear jewelry, make-up, or FINGER nail polish No metal in your hair or on your body. Do not wear lotions, powders, perfumes.  You may wear deodorant.  Do not bring valuables to the hospital. Contacts, dentures or bridgework may not be worn into surgery.  Leave suitcase in the car. After Surgery it may be brought to your room. For patients being admitted to the hospital, checkout time is 11:00am the day of discharge.      Instrucciones:  Su cirugia esta programada para-( your procedure is scheduled on) :_____: January 10 _____________  Lamar Benes la entrada principal a la(s) -(enter through the main entrance at):______10am_______  [employer] telefono,  Reese e informenos de su llegada ( pick up phone, dial [phone] on arrival)  Por favor llame al [phone] si tiene algun problema la [place] ( please call [phone] if you have any problems the morning of surgery.)  Recuerde: (Remember)  No coma alimentos ni tome liquidos, incluyendo agua, despues de la medianoche del  ( Do not eat food or drink liquids including water after midnight on_____jan 9 Monday night__________  Jomarie Longs estas medicinas la manana de la cirugia con un sorbito de agua (take these meds the morning of surgery with a SIP of water)___________ take blood pressure meds as you normally take them_______________________  [employer] dientes en la manana de la [place]. (you may brush your teeth the morning of surgery)  NO use joyas,  maquillaje de ojos, lapiz labial, crema para el cuerpo o esmalte de unas oscuro - las unas de los pies pueden estar pintados. ( Do not wear jewelry, eye makeup, lipstick, body lotion, or dark fingernail polish)  Puede usar desodorante ( you may wear deodorant)  Si va a ser ingresado despues de las [place], deje la [place] en el carro hasta que se le haya asignado una habitacion. ( If you are to be admitted after surgery, leave suitcase in car until your room has been assigned.)  A los pacientes que se les de de alta el mismo dia no se les permitira manejar a casa.  ( Patients discharged on the day of surgery will not be allowed to drive home)  Use ropa suelta y comoda de regreso a Manufacturing engineer. ( wear loose comfortable clothes for ride home)  [place] (patient signature) ______________________________________

## 2015-06-04 ENCOUNTER — Encounter (HOSPITAL_COMMUNITY)
Admission: RE | Disposition: A | Discharge status: Home / Self Care | Payer: Self-pay | Source: Ambulatory Visit | Attending: Obstetrics & Gynecology

## 2015-06-04 ENCOUNTER — Ambulatory Visit (HOSPITAL_COMMUNITY): Payer: Self-pay | Admitting: Anesthesiology

## 2015-06-04 ENCOUNTER — Observation Stay (HOSPITAL_COMMUNITY)
Admission: RE | Admit: 2015-06-04 | Discharge: 2015-06-05 | Disposition: A | Discharge status: Home / Self Care | Payer: Self-pay | Source: Ambulatory Visit | Attending: Obstetrics & Gynecology | Admitting: Obstetrics & Gynecology

## 2015-06-04 ENCOUNTER — Encounter (HOSPITAL_COMMUNITY): Payer: Self-pay | Admitting: Anesthesiology

## 2015-06-04 DIAGNOSIS — Z79899 Other long term (current) drug therapy: Secondary | ICD-10-CM | POA: Insufficient documentation

## 2015-06-04 DIAGNOSIS — Z87891 Personal history of nicotine dependence: Secondary | ICD-10-CM | POA: Insufficient documentation

## 2015-06-04 DIAGNOSIS — I10 Essential (primary) hypertension: Secondary | ICD-10-CM | POA: Insufficient documentation

## 2015-06-04 DIAGNOSIS — D259 Leiomyoma of uterus, unspecified: Principal | ICD-10-CM | POA: Insufficient documentation

## 2015-06-04 DIAGNOSIS — N938 Other specified abnormal uterine and vaginal bleeding: Secondary | ICD-10-CM | POA: Insufficient documentation

## 2015-06-04 DIAGNOSIS — Z9889 Other specified postprocedural states: Secondary | ICD-10-CM

## 2015-06-04 DIAGNOSIS — R102 Pelvic and perineal pain: Secondary | ICD-10-CM

## 2015-06-04 HISTORY — PX: MYOMECTOMY: SHX85

## 2015-06-04 LAB — PREGNANCY, URINE: Preg Test, Ur: NEGATIVE

## 2015-06-04 SURGERY — MYOMECTOMY, ABDOMINAL APPROACH
Anesthesia: General | Site: Abdomen

## 2015-06-04 MED ORDER — FENTANYL CITRATE (PF) 250 MCG/5ML IJ SOLN
INTRAMUSCULAR | Status: AC
  Filled 2015-06-04: qty 5

## 2015-06-04 MED ORDER — GLYCOPYRROLATE 0.2 MG/ML IJ SOLN
INTRAMUSCULAR | Status: AC
  Filled 2015-06-04: qty 3

## 2015-06-04 MED ORDER — LACTATED RINGERS IV SOLN
INTRAVENOUS | Status: DC
  Administered 2015-06-04 (×2): via INTRAVENOUS

## 2015-06-04 MED ORDER — SODIUM CHLORIDE 0.9 % IJ SOLN
INTRAMUSCULAR | Status: AC
  Filled 2015-06-04: qty 100

## 2015-06-04 MED ORDER — GLYCOPYRROLATE 0.2 MG/ML IJ SOLN
INTRAMUSCULAR | Status: DC | PRN
  Administered 2015-06-04: 0.6 mg via INTRAVENOUS

## 2015-06-04 MED ORDER — NEOSTIGMINE METHYLSULFATE 10 MG/10ML IV SOLN
INTRAVENOUS | Status: AC
  Filled 2015-06-04: qty 1

## 2015-06-04 MED ORDER — PROMETHAZINE HCL 25 MG/ML IJ SOLN: 6.2500 mg | INTRAMUSCULAR | Status: DC | PRN

## 2015-06-04 MED ORDER — BUPIVACAINE HCL (PF) 0.5 % IJ SOLN
INTRAMUSCULAR | Status: DC | PRN
  Administered 2015-06-04: 20 mL

## 2015-06-04 MED ORDER — KETOROLAC TROMETHAMINE 30 MG/ML IJ SOLN
INTRAMUSCULAR | Status: DC | PRN
  Administered 2015-06-04: 30 mg via INTRAVENOUS

## 2015-06-04 MED ORDER — CEFAZOLIN SODIUM-DEXTROSE 2-3 GM-% IV SOLR
2.0000 g | INTRAVENOUS | Status: AC
  Administered 2015-06-04: 2 g via INTRAVENOUS

## 2015-06-04 MED ORDER — EPHEDRINE SULFATE 50 MG/ML IJ SOLN
INTRAMUSCULAR | Status: DC | PRN
  Administered 2015-06-04: 15 mg via INTRAVENOUS

## 2015-06-04 MED ORDER — LACTATED RINGERS IV SOLN
INTRAVENOUS | Status: DC
  Administered 2015-06-04: 125 mL/h via INTRAVENOUS
  Administered 2015-06-04: 11:00:00 via INTRAVENOUS

## 2015-06-04 MED ORDER — KETOROLAC TROMETHAMINE 30 MG/ML IJ SOLN
INTRAMUSCULAR | Status: AC
  Filled 2015-06-04: qty 1

## 2015-06-04 MED ORDER — ROCURONIUM BROMIDE 100 MG/10ML IV SOLN
INTRAVENOUS | Status: AC
  Filled 2015-06-04: qty 1

## 2015-06-04 MED ORDER — ROCURONIUM BROMIDE 100 MG/10ML IV SOLN
INTRAVENOUS | Status: DC | PRN
  Administered 2015-06-04: 40 mg via INTRAVENOUS

## 2015-06-04 MED ORDER — ONDANSETRON HCL 4 MG/2ML IJ SOLN
INTRAMUSCULAR | Status: AC
  Filled 2015-06-04: qty 2

## 2015-06-04 MED ORDER — SCOPOLAMINE 1 MG/3DAYS TD PT72
1.0000 | MEDICATED_PATCH | Freq: Once | TRANSDERMAL | Status: DC
  Administered 2015-06-04: 1.5 mg via TRANSDERMAL

## 2015-06-04 MED ORDER — SCOPOLAMINE 1 MG/3DAYS TD PT72
MEDICATED_PATCH | TRANSDERMAL | Status: AC
  Administered 2015-06-04: 1.5 mg via TRANSDERMAL
  Filled 2015-06-04: qty 1

## 2015-06-04 MED ORDER — ONDANSETRON HCL 4 MG/2ML IJ SOLN: 4.0000 mg | Freq: Four times a day (QID) | INTRAMUSCULAR | Status: DC | PRN

## 2015-06-04 MED ORDER — OXYCODONE-ACETAMINOPHEN 5-325 MG PO TABS
1.0000 | ORAL_TABLET | ORAL | Status: DC | PRN
  Administered 2015-06-04 (×2): 2 via ORAL
  Administered 2015-06-05 (×3): 1 via ORAL
  Filled 2015-06-04: qty 1
  Filled 2015-06-04: qty 2
  Filled 2015-06-04 (×2): qty 1
  Filled 2015-06-04: qty 2

## 2015-06-04 MED ORDER — BUPIVACAINE HCL (PF) 0.5 % IJ SOLN
INTRAMUSCULAR | Status: AC
  Filled 2015-06-04: qty 30

## 2015-06-04 MED ORDER — EPHEDRINE 5 MG/ML INJ
INTRAVENOUS | Status: AC
  Filled 2015-06-04: qty 10

## 2015-06-04 MED ORDER — NEOSTIGMINE METHYLSULFATE 10 MG/10ML IV SOLN
INTRAVENOUS | Status: DC | PRN
  Administered 2015-06-04: 3 mg via INTRAVENOUS

## 2015-06-04 MED ORDER — DEXAMETHASONE SODIUM PHOSPHATE 10 MG/ML IJ SOLN
INTRAMUSCULAR | Status: DC | PRN
  Administered 2015-06-04: 4 mg via INTRAVENOUS

## 2015-06-04 MED ORDER — PROPOFOL 10 MG/ML IV BOLUS
INTRAVENOUS | Status: AC
  Filled 2015-06-04: qty 20

## 2015-06-04 MED ORDER — MIDAZOLAM HCL 2 MG/2ML IJ SOLN
INTRAMUSCULAR | Status: DC | PRN
  Administered 2015-06-04: 2 mg via INTRAVENOUS

## 2015-06-04 MED ORDER — MIDAZOLAM HCL 2 MG/2ML IJ SOLN
INTRAMUSCULAR | Status: AC
  Filled 2015-06-04: qty 2

## 2015-06-04 MED ORDER — HYDROMORPHONE HCL 1 MG/ML IJ SOLN
INTRAMUSCULAR | Status: AC
  Administered 2015-06-04: 0.5 mg via INTRAVENOUS
  Filled 2015-06-04: qty 1

## 2015-06-04 MED ORDER — VASOPRESSIN 20 UNIT/ML IV SOLN
INTRAVENOUS | Status: AC
  Filled 2015-06-04: qty 2

## 2015-06-04 MED ORDER — ONDANSETRON HCL 4 MG/2ML IJ SOLN
INTRAMUSCULAR | Status: DC | PRN
  Administered 2015-06-04: 4 mg via INTRAVENOUS

## 2015-06-04 MED ORDER — PROPOFOL 10 MG/ML IV BOLUS
INTRAVENOUS | Status: DC | PRN
  Administered 2015-06-04: 200 mg via INTRAVENOUS

## 2015-06-04 MED ORDER — LIDOCAINE HCL (CARDIAC) 20 MG/ML IV SOLN
INTRAVENOUS | Status: AC
  Filled 2015-06-04: qty 5

## 2015-06-04 MED ORDER — HYDROMORPHONE HCL 1 MG/ML IJ SOLN
0.2000 mg | INTRAMUSCULAR | Status: DC | PRN
  Administered 2015-06-04: 0.6 mg via INTRAVENOUS
  Filled 2015-06-04: qty 1

## 2015-06-04 MED ORDER — FENTANYL CITRATE (PF) 100 MCG/2ML IJ SOLN
INTRAMUSCULAR | Status: DC | PRN
  Administered 2015-06-04 (×5): 50 ug via INTRAVENOUS

## 2015-06-04 MED ORDER — LIDOCAINE HCL (CARDIAC) 20 MG/ML IV SOLN
INTRAVENOUS | Status: DC | PRN
  Administered 2015-06-04: 70 mg via INTRAVENOUS
  Administered 2015-06-04: 30 mg via INTRAVENOUS

## 2015-06-04 MED ORDER — DEXAMETHASONE SODIUM PHOSPHATE 4 MG/ML IJ SOLN
INTRAMUSCULAR | Status: AC
  Filled 2015-06-04: qty 1

## 2015-06-04 MED ORDER — CEFAZOLIN SODIUM-DEXTROSE 2-3 GM-% IV SOLR: 2.0000 g | INTRAVENOUS | Status: DC

## 2015-06-04 MED ORDER — HYDROMORPHONE HCL 1 MG/ML IJ SOLN
0.2500 mg | INTRAMUSCULAR | Status: DC | PRN
  Administered 2015-06-04 (×4): 0.5 mg via INTRAVENOUS

## 2015-06-04 MED ORDER — CEFAZOLIN SODIUM-DEXTROSE 2-3 GM-% IV SOLR
INTRAVENOUS | Status: AC
  Filled 2015-06-04: qty 50

## 2015-06-04 MED ORDER — IBUPROFEN 800 MG PO TABS
800.0000 mg | ORAL_TABLET | Freq: Three times a day (TID) | ORAL | Status: DC | PRN
  Administered 2015-06-04 – 2015-06-05 (×2): 800 mg via ORAL
  Filled 2015-06-04 (×2): qty 1

## 2015-06-04 MED ORDER — ONDANSETRON HCL 4 MG PO TABS: 4.0000 mg | ORAL_TABLET | Freq: Four times a day (QID) | ORAL | Status: DC | PRN

## 2015-06-04 MED ORDER — SODIUM CHLORIDE 0.9 % IV SOLN
INTRAVENOUS | Status: DC | PRN
  Administered 2015-06-04: 3 mL via INTRAMUSCULAR

## 2015-06-04 SURGICAL SUPPLY — 35 items
BARRIER ADHS 3X4 INTERCEED (GAUZE/BANDAGES/DRESSINGS) ×3 IMPLANT
CANISTER SUCT 3000ML (MISCELLANEOUS) ×3 IMPLANT
CLOSURE WOUND 1/2 X4 (GAUZE/BANDAGES/DRESSINGS) ×1
CLOTH BEACON ORANGE TIMEOUT ST (SAFETY) ×3 IMPLANT
CONT PATH 16OZ SNAP LID 3702 (MISCELLANEOUS) ×3 IMPLANT
DECANTER SPIKE VIAL GLASS SM (MISCELLANEOUS) ×3 IMPLANT
DRSG OPSITE POSTOP 4X10 (GAUZE/BANDAGES/DRESSINGS) ×6 IMPLANT
DURAPREP 26ML APPLICATOR (WOUND CARE) ×3 IMPLANT
GAUZE SPONGE 4X4 16PLY XRAY LF (GAUZE/BANDAGES/DRESSINGS) ×3 IMPLANT
GLOVE BIO SURGEON STRL SZ 6.5 (GLOVE) ×2 IMPLANT
GLOVE BIO SURGEONS STRL SZ 6.5 (GLOVE) ×1
GLOVE BIOGEL PI IND STRL 7.0 (GLOVE) ×1 IMPLANT
GLOVE BIOGEL PI INDICATOR 7.0 (GLOVE) ×2
GOWN STRL REUS W/TWL LRG LVL3 (GOWN DISPOSABLE) ×9 IMPLANT
NEEDLE SPNL 18GX3.5 QUINCKE PK (NEEDLE) ×3 IMPLANT
NS IRRIG 1000ML POUR BTL (IV SOLUTION) ×3 IMPLANT
PACK ABDOMINAL GYN (CUSTOM PROCEDURE TRAY) ×3 IMPLANT
PAD OB MATERNITY 4.3X12.25 (PERSONAL CARE ITEMS) ×3 IMPLANT
PENCIL SMOKE EVAC W/HOLSTER (ELECTROSURGICAL) ×3 IMPLANT
SPONGE LAP 18X18 X RAY DECT (DISPOSABLE) ×3 IMPLANT
STRIP CLOSURE SKIN 1/2X4 (GAUZE/BANDAGES/DRESSINGS) ×2 IMPLANT
SUT PDS AB 0 CTX 60 (SUTURE) ×3 IMPLANT
SUT VIC AB 0 CT1 27 (SUTURE) ×4
SUT VIC AB 0 CT1 27XBRD ANBCTR (SUTURE) ×2 IMPLANT
SUT VIC AB 3-0 CT1 27 (SUTURE) ×2
SUT VIC AB 3-0 CT1 TAPERPNT 27 (SUTURE) ×1 IMPLANT
SUT VIC AB 3-0 SH 27 (SUTURE) ×4
SUT VIC AB 3-0 SH 27X BRD (SUTURE) ×2 IMPLANT
SUT VIC AB 4-0 SH 27 (SUTURE) ×2
SUT VIC AB 4-0 SH 27XANBCTRL (SUTURE) ×1 IMPLANT
SUT VICRYL 0 TIES 12 18 (SUTURE) IMPLANT
SYR 30ML LL (SYRINGE) ×3 IMPLANT
TOWEL OR 17X24 6PK STRL BLUE (TOWEL DISPOSABLE) ×6 IMPLANT
TRAY FOLEY CATH SILVER 14FR (SET/KITS/TRAYS/PACK) ×3 IMPLANT
WATER STERILE IRR 1000ML POUR (IV SOLUTION) ×3 IMPLANT

## 2015-06-04 NOTE — Anesthesia Procedure Notes (Signed)
Procedure Name: Intubation Date/Time: 06/04/2015 11:09 AM Performed by: Tobin Chad Pre-anesthesia Checklist: Patient identified, Timeout performed, Emergency Drugs available, Patient being monitored and Suction available Patient Re-evaluated:Patient Re-evaluated prior to inductionOxygen Delivery Method: Circle system utilized and Simple face mask Preoxygenation: Pre-oxygenation with 100% oxygen Intubation Type: IV induction and Inhalational induction Ventilation: Mask ventilation without difficulty Laryngoscope Size: Mac and 3 Grade View: Grade I Tube type: Oral Tube size: 7.0 mm Number of attempts: 1 Placement Confirmation: ETT inserted through vocal cords under direct vision,  positive ETCO2 and breath sounds checked- equal and bilateral Secured at: 22 cm Tube secured with: Tape Dental Injury: Teeth and Oropharynx as per pre-operative assessment

## 2015-06-04 NOTE — Anesthesia Preprocedure Evaluation (Addendum)
Anesthesia Evaluation  Patient identified by MRN, date of birth, ID band  Reviewed: Allergy & Precautions, NPO status , Patient's Chart, lab work & pertinent test results  Airway Mallampati: II  TM Distance: >3 FB Neck ROM: Full    Dental   Pulmonary former smoker,    breath sounds clear to auscultation       Cardiovascular hypertension,  Rhythm:Regular Rate:Normal     Neuro/Psych    GI/Hepatic negative GI ROS, Neg liver ROS,   Endo/Other  negative endocrine ROS  Renal/GU Renal disease     Musculoskeletal   Abdominal   Peds  Hematology   Anesthesia Other Findings   Reproductive/Obstetrics                            Anesthesia Physical Anesthesia Plan  ASA: III  Anesthesia Plan: General   Post-op Pain Management:    Induction: Intravenous  Airway Management Planned: Oral ETT  Additional Equipment:   Intra-op Plan:   Post-operative Plan: Extubation in OR  Informed Consent: I have reviewed the patients History and Physical, chart, labs and discussed the procedure including the risks, benefits and alternatives for the proposed anesthesia with the patient or authorized representative who has indicated his/her understanding and acceptance.   Dental advisory given  Plan Discussed with: CRNA and Anesthesiologist  Anesthesia Plan Comments:        Anesthesia Quick Evaluation

## 2015-06-04 NOTE — Addendum Note (Signed)
Addendum  created 06/04/15 2114 by Flossie Dibble, CRNA   Modules edited: Notes Section   Notes Section:  File: [id number]

## 2015-06-04 NOTE — Anesthesia Postprocedure Evaluation (Signed)
Anesthesia Post Note  Patient: Catherine Ingram  Procedure(s) Performed: Procedure(s) (LRB): MYOMECTOMY (N/A)  Patient location during evaluation: PACU Anesthesia Type: General Level of consciousness: awake Pain management: pain level controlled Vital Signs Assessment: post-procedure vital signs reviewed and stable Respiratory status: spontaneous breathing Cardiovascular status: stable Anesthetic complications: no    Last Vitals:  Filed Vitals:   06/04/15 1300 06/04/15 1315  BP: 121/81 120/81  Pulse: 60 64  Temp:  36.8 C  Resp: 15 15    Last Pain:  Filed Vitals:   06/04/15 1319  PainSc: 6                  EDWARDS,Jerad Dunlap

## 2015-06-04 NOTE — Anesthesia Postprocedure Evaluation (Signed)
Anesthesia Post Note  Patient: Catherine Ingram  Procedure(s) Performed: Procedure(s) (LRB): MYOMECTOMY (N/A)  Patient location during evaluation: [facility] Anesthesia Type: General Level of consciousness: awake and alert Pain management: satisfactory to patient Vital Signs Assessment: post-procedure vital signs reviewed and stable Respiratory status: spontaneous breathing and respiratory function stable Cardiovascular status: stable Postop Assessment: adequate PO intake Anesthetic complications: no    Last Vitals:  Filed Vitals:   06/04/15 1449 06/04/15 1916  BP: 107/73 102/60  Pulse: 81 72  Temp:  36.7 C  Resp: 18 18    Last Pain:  Filed Vitals:   06/04/15 2022  PainSc: 2                  [name]

## 2015-06-04 NOTE — Discharge Instructions (Signed)
Exploratory Laparotomy, Adult, Care After °Refer to this sheet in the next few weeks. These instructions provide you with information about caring for yourself after your procedure. Your health care provider may also give you more specific instructions. Your treatment has been planned according to current medical practices, but problems sometimes occur. Call your health care provider if you have any problems or questions after your procedure. °WHAT TO EXPECT AFTER THE PROCEDURE °After your procedure, it is typical to have: °· Abdominal soreness. °· Fatigue. °· A sore throat from tubes in your throat. °· A lack of appetite. °HOME CARE INSTRUCTIONS °Medicines °· Take medicines only as directed by your health care provider. °· Do not drive or operate heavy machinery while taking pain medicine. °Incision Care °· There are many different ways to close and cover an incision, including stitches (sutures), skin glue, and adhesive strips. Follow your health care provider's instructions about: °¨ Incision care. °¨ Bandage (dressing) changes and removal. °¨ Incision closure removal. °· Do not take showers or baths until your health care provider says that you can. °· Check your incision area daily for signs of infection. Watch for: °¨ Redness. °¨ Tenderness. °¨ Swelling. °¨ Drainage. °Activities °· Do not lift anything that is heavier than 10 pounds (4.5 kg) until your health care provider says that it is safe. °· Try to walk a little bit each day if your health care provider says that it is okay. °· Ask your health care provider when you can start to do your usual activities again, such as driving, going back to work, and having sex. °Eating and Drinking °· You may eat what you usually eat. Include lots of whole grains, fruits, and vegetables in your diet. This will help to prevent constipation. °· Drink enough fluid to keep your urine clear or pale yellow. °General Instructions °· Keep all follow-up visits as directed by  your health care provider. This is important. °SEEK MEDICAL CARE IF:  °· You have a fever. °· You have chills. °· Your pain medicine is not helping. °· You have constipation or diarrhea. °· You have nausea or vomiting. °· You have drainage, redness, swelling, or pain at your incision site. °SEEK IMMEDIATE MEDICAL CARE IF: °· Your pain is getting worse. °· It has been more than 3 days since you been able to have a bowel movement. °· You have ongoing (persistent) vomiting. °· The edges of your incision open up. °· You have warmth, tenderness, and swelling in your calf. °· You have trouble breathing. °· You have chest pain. °  °This information is not intended to replace advice given to you by your health care provider. Make sure you discuss any questions you have with your health care provider. °  °Document Released: 12/24/2003 Document Revised: 06/01/2014 Document Reviewed: 12/27/2013 °Elsevier Interactive Patient Education ©2016 [employer] ° °

## 2015-06-04 NOTE — OR Nursing (Signed)
Catherine Ingram (spanish interpreter talks to patient)

## 2015-06-04 NOTE — Op Note (Signed)
06/04/2015  12:06 PM  PATIENT:  Catherine Ingram  40 y.o. female  PRE-OPERATIVE DIAGNOSIS:  Dysfunctional Uterine Bleeding,  pelvic pain due to fibroid, desire for pregnancy  POST-OPERATIVE DIAGNOSIS: Dysfunctional uterine bleeding, pelvic pain, and desire for pregnancy  PROCEDURE:  Procedure(s): MYOMECTOMY (N/A)  SURGEON:  Surgeon(s) and Role:    * Desmond Szabo Marijo Sanes, MD - Primary    * Lavonia Drafts, MD - Assisting  EBL:  Total I/O In: 800 [I.V.:800] Out: 125 [Urine:100; Blood:25]  BLOOD ADMINISTERED:none  DRAINS: none   LOCAL MEDICATIONS USED:  MARCAINE     SPECIMEN:  Source of Specimen:  fibroid  DISPOSITION OF SPECIMEN:  PATHOLOGY  COUNTS:  YES  TOURNIQUET:  * No tourniquets in log *  DICTATION: .Dragon Dictation  PLAN OF CARE: Admit to inpatient   PATIENT DISPOSITION:  PACU - hemodynamically stable.   Delay start of Pharmacological VTE agent (>24hrs) due to surgical blood loss or risk of bleeding: not applicable     The risks, benefits, and alternatives of surgery were explained, understood, and accepted. Consents were signed. All questions were answered. She was taken to the operating room and general anesthesia was applied without complication. Her abdomen and vagina were prepped and draped in the usual sterile fashion. A Foley catheter was placed which drained clear urine throughout the case. A transverse incision was made approximately 2 cm above her symphysis pubis at the site of her previous scar after injecting 20 mL of 0.5% marcaine in the subcutaneous tissue. The incision was carried down through the subcutaneous tissue to the fascia. Bleeding encountered was cauterized with the Bovie. The fascia was scored the midline and the fascial incision was extended bilaterally. The pyramidalis muscles were separated in a transverse fashion using electrosurgical technique. Approximately 1 cm of the rectus muscles were separated in a transverse fashion in the  midline using electrosurgical technique. Hemostasis was maintained. The peritoneum was entered with hemostats and the peritoneal incision was extended bilaterally with the Bovie, taking care to avoid bowel and bladder. The patient was placed in Trendelenburg position and her bowel was packed out of the abdominal cavity. The pelvis was inspected. Her  uterus was elevated out of the incision. The 5 cm fibroid was at the fundus. I injected dilute pitressin in the serosa over the fibroid and made a transverse incision.  I used towel clamps to elevate the fibroid out of the incision. I removed the fibroid with blunt and electrosurgical dissection. The endometrium was not entered. I closed the defect with 2 layers of 0 vicryl locking sutures. Excellent hemostasis was noted. I removed the excess uterine tissue created by removing the fibroid. The edges were closed with a baseball type stick with 3-0 vicryl. Excellent hemostasis was noted. The fundus of the uterus was covered with Interceed. I closed her fascia with a 1 prolene suture in a running fashion. No defects were palpable. I did a subquticular closure with a 3-0 vicryl suture. She tolerated the procedure well and was taken to the recovery room in stable condition. Her Foley catheter drained clear urine throughout.

## 2015-06-04 NOTE — Transfer of Care (Signed)
Immediate Anesthesia Transfer of Care Note  Patient: Nonia Wietecha  Procedure(s) Performed: Procedure(s): MYOMECTOMY (N/A)  Patient Location: PACU  Anesthesia Type:General  Level of Consciousness: awake, alert  and oriented  Airway & Oxygen Therapy: Patient Spontanous Breathing and Patient connected to nasal cannula oxygen  Post-op Assessment: Report given to RN, Post -op Vital signs reviewed and stable and Patient moving all extremities  Post vital signs: Reviewed and stable  Last Vitals:  Filed Vitals:   06/04/15 1024  BP: 145/93  Pulse: 63  Temp: 36.6 C  Resp: 20    Complications: No apparent anesthesia complications

## 2015-06-04 NOTE — H&P (Signed)
Catherine Ingram is an 40 yo SHP2 here for follow up for her DUB. Her hbg, tsh were normal. Her u/s showed a 4.9 cm anterior fundal fibroid. She has been trying to get pregnant with her long term partner.  Pertinent Gynecological History: Menses: flow is excessive with use of 10  pads or tampons on heaviest days Bleeding: dysfunctional uterine bleeding Contraception: none DES exposure: denies Blood transfusions: none Sexually transmitted diseases: dermoids removed in 2010 Previous GYN Procedures: none  Last mammogram: n/a Date:      Menstrual History: Menarche age: 49 No LMP recorded.    Past Medical History  Diagnosis Date  . Hypertension   . Kidney stones   . Headache     Past Surgical History  Procedure Laterality Date  . Ovarian cyst removal    . Wisdom tooth extraction  01/2013  . Appendectomy      2015    Family History  Problem Relation Age of Onset  . Hypertension Mother   . Diabetes Mother   . Hypertension Father   . Stroke Father     Social History:  reports that she quit smoking about 17 years ago. Her smoking use included Cigarettes. She has never used smokeless tobacco. She reports that she does not drink alcohol or use illicit drugs.  Allergies: No Known Allergies  Prescriptions prior to admission  Medication Sig Dispense Refill Last Dose  . amLODipine (NORVASC) 10 MG tablet Take 10 mg by mouth daily.   Taking  . Homeopathic Products (HYLAFEM) SUPP Place 1 suppository vaginally at bedtime. (Patient not taking: Reported on 06/04/2015) 7 suppository 3 Not Taking at Unknown time  . hydrochlorothiazide (HYDRODIURIL) 25 MG tablet Take 12.5 mg by mouth daily.   Taking    ROS  Blood pressure 145/93, pulse 63, temperature 97.8 F (36.6 C), temperature source Oral, resp. rate 20, SpO2 100 %. Physical Exam  Heart- rrr Lungs- CTAB Abd- benign  Results for orders placed or performed during the hospital encounter of 06/04/15 (from the past 24 hour(s))   Pregnancy, urine     Status: None   Collection Time: 06/04/15 10:17 AM  Result Value Ref Range   Preg Test, Ur NEGATIVE NEGATIVE    No results found.  Assessment/Plan: Symptomatic fibroid and desire for pregnancy- I will plan to do a myomectomy. She will only get a hysterectomy if there is lifethreatening bleeding. She understands the risks of surgery, including, but not to infection, bleeding, DVTs, damage to bowel, bladder, ureters. She wishes to proceed.     Catherine Ingram C. 06/04/2015, 10:45 AM

## 2015-06-04 NOTE — Transfer of Care (Signed)
Immediate Anesthesia Transfer of Care Note  Patient: Catherine Ingram  Procedure(s) Performed: Procedure(s): MYOMECTOMY (N/A)  Patient Location: PACU  Anesthesia Type:General  Level of Consciousness: awake, alert  and oriented  Airway & Oxygen Therapy: Patient Spontanous Breathing and Patient connected to nasal cannula oxygen  Post-op Assessment: Report given to RN, Post -op Vital signs reviewed and stable and Patient moving all extremities  Post vital signs: Reviewed and stable  Last Vitals:  Filed Vitals:   06/04/15 1024  BP: 145/93  Pulse: 63  Temp: 36.6 C  Resp: 20    Complications: No apparent anesthesia complications

## 2015-06-05 ENCOUNTER — Encounter (HOSPITAL_COMMUNITY): Payer: Self-pay | Admitting: Obstetrics & Gynecology

## 2015-06-05 LAB — CBC
HEMATOCRIT: 34.3 % — AB (ref 36.0–46.0)
HEMOGLOBIN: 11.3 g/dL — AB (ref 12.0–15.0)
MCH: 30.1 pg (ref 26.0–34.0)
MCHC: 32.9 g/dL (ref 30.0–36.0)
MCV: 91.5 fL (ref 78.0–100.0)
Platelets: 217 10*3/uL (ref 150–400)
RBC: 3.75 MIL/uL — ABNORMAL LOW (ref 3.87–5.11)
RDW: 13.2 % (ref 11.5–15.5)
WBC: 11.4 10*3/uL — ABNORMAL HIGH (ref 4.0–10.5)

## 2015-06-05 MED ORDER — OXYCODONE-ACETAMINOPHEN 5-325 MG PO TABS: 1.0000 | ORAL_TABLET | ORAL | Status: DC | PRN

## 2015-06-05 MED ORDER — IBUPROFEN 800 MG PO TABS: 800.0000 mg | ORAL_TABLET | Freq: Three times a day (TID) | ORAL | Status: DC | PRN

## 2015-06-05 NOTE — Progress Notes (Signed)
Discharge instructions reviewed with patient.  Patient states understanding of home care, medications, activity, signs/symptoms to report to MD and return MD office visit.  Patients significant other and family will assist with her care @ home.  No home  equipment needed, patient has prescriptions and all personal belongings.  Patient discharge per wheelchair in stable condition with staff without incident.

## 2015-06-05 NOTE — Discharge Summary (Signed)
Physician Discharge Summary  Patient ID: Catherine Ingram MRN: [id number] DOB/AGE: [birth year] 40 y.o.  Admit date: 06/04/2015 Discharge date: 06/05/2015  Admission Diagnoses: symptomatic fibroid  Discharge Diagnoses: same Active Problems:   Post-operative state   Discharged Condition: good  Hospital Course: She underwent an uncomplicated abdominal myomectomy. By POD#1 she was ambulating, tolerating po, having flatus, and voiced her readiness to go home.  Consults: None  Significant Diagnostic Studies: labs: stable h/h  Treatments: surgery: abdominal myomectomy  Discharge Exam: Blood pressure 95/55, pulse 51, temperature 98.3 F (36.8 C), temperature source Oral, resp. rate 17, SpO2 97 %. General appearance: alert Cardio: regular rate and rhythm, S1, S2 normal, no murmur, click, rub or gallop GI: soft, non-tender; bowel sounds normal; no masses,  no organomegaly Incision/Wound: c/d/i  Disposition:      Medication List    TAKE these medications        amLODipine 10 MG tablet  Commonly known as:  NORVASC  Take 10 mg by mouth daily.     hydrochlorothiazide 25 MG tablet  Commonly known as:  HYDRODIURIL  Take 12.5 mg by mouth daily.     ibuprofen 800 MG tablet  Commonly known as:  ADVIL,MOTRIN  Take 1 tablet (800 mg total) by mouth every 8 (eight) hours as needed (mild pain).     oxyCODONE-acetaminophen 5-325 MG tablet  Commonly known as:  PERCOCET/ROXICET  Take 1-2 tablets by mouth every 4 (four) hours as needed for severe pain (moderate to severe pain (when tolerating fluids)).      ASK your doctor about these medications        HYLAFEM Supp  Place 1 suppository vaginally at bedtime.           Follow-up Information    Follow up with Brysin Towery C., MD. Schedule an appointment as soon as possible for a visit in 6 weeks.   Specialty:  Obstetrics and Gynecology   Contact information:   [place] [address] [phone]        Signed: Emily Filbert 06/05/2015, 12:27 PM

## 2015-07-17 ENCOUNTER — Ambulatory Visit: Payer: Self-pay | Admitting: Obstetrics & Gynecology

## 2015-07-17 ENCOUNTER — Encounter: Payer: Self-pay | Admitting: Obstetrics & Gynecology

## 2015-07-17 VITALS — BP 131/91 | HR 53 | Temp 98.3°F | Resp 20 | Ht 66.0 in | Wt 170.7 lb

## 2015-07-17 DIAGNOSIS — Z9889 Other specified postprocedural states: Secondary | ICD-10-CM

## 2015-07-17 DIAGNOSIS — D251 Intramural leiomyoma of uterus: Secondary | ICD-10-CM

## 2015-07-17 NOTE — Progress Notes (Signed)
   Subjective:    Patient ID: Catherine Ingram, female    DOB: 1975/06/13, 40 y.o.   MRN: [id number]  HPI 40 yo MH lady is here 6 weeks post op s/p abdominal myomectomy. She is having no problems, wants to know when she can try for a pregnancy. She is taking PNVs daily.   Review of Systems     Objective:   Physical Exam [id number] Breathing, conversing, and ambulating normally Abd- benign Incision- healed well       Assessment & Plan:  Post op doing well Rec wait until NMP to start trying to conceive

## 2015-07-17 NOTE — Progress Notes (Signed)
Interpreter Desma Mcgregor present for encounter.

## 2015-07-22 ENCOUNTER — Telehealth: Payer: Self-pay | Admitting: *Deleted

## 2015-07-22 DIAGNOSIS — B379 Candidiasis, unspecified: Secondary | ICD-10-CM

## 2015-07-22 MED ORDER — FLUCONAZOLE 150 MG PO TABS: 150.0000 mg | ORAL_TABLET | Freq: Once | ORAL | Status: DC

## 2015-07-22 NOTE — Telephone Encounter (Signed)
Pt complaining of yeast symptoms. Diflucan called to her pharmacy per protocol.

## 2015-07-25 ENCOUNTER — Telehealth: Payer: Self-pay | Admitting: *Deleted

## 2015-07-25 NOTE — Telephone Encounter (Signed)
Catherine Ingram left a voice message this am in [place]. Interpreter Catherine Ingram listened to voicemail and relayed to nurse. [name] states she recently was seen for a vaginal infection and got a pill and they told her if it didn't make it go away to call back to be seen or get another prescription.

## 2015-07-26 NOTE — Telephone Encounter (Signed)
Patient called, stating that she did not receive call back from nurse and wanted to know what was happening with her Rx. Informed patient that I was personally going to give message to nurses to call her regarding her concern.

## 2015-07-26 NOTE — Telephone Encounter (Signed)
Returned call to pt w/Catherine Ingram. Pt was advised to obtain refill of diflucan which was previously prescribed. If she is still having the same sx after 3 days she should call us back for an alternate medication to treat the yeast. Pt voiced understanding.

## 2016-08-03 ENCOUNTER — Ambulatory Visit: Payer: Self-pay | Admitting: Obstetrics & Gynecology

## 2016-10-27 ENCOUNTER — Ambulatory Visit (INDEPENDENT_AMBULATORY_CARE_PROVIDER_SITE_OTHER): Payer: Self-pay | Admitting: Lab

## 2016-10-27 DIAGNOSIS — Z113 Encounter for screening for infections with a predominantly sexual mode of transmission: Secondary | ICD-10-CM

## 2016-10-27 DIAGNOSIS — N898 Other specified noninflammatory disorders of vagina: Secondary | ICD-10-CM

## 2016-10-27 NOTE — Progress Notes (Signed)
Patient complains of having white discharge for 3 months.Instructed patient on how to self swab.  Patient reports understanding at this time. Also advised patient to try Monistat otc, and we will call her back with results.

## 2016-10-29 LAB — CERVICOVAGINAL ANCILLARY ONLY
Bacterial vaginitis: NEGATIVE
CANDIDA VAGINITIS: NEGATIVE
Chlamydia: NEGATIVE
Neisseria Gonorrhea: NEGATIVE
Trichomonas: NEGATIVE

## 2016-11-19 ENCOUNTER — Ambulatory Visit: Payer: Self-pay | Admitting: Obstetrics & Gynecology

## 2016-12-17 ENCOUNTER — Ambulatory Visit: Payer: Self-pay | Admitting: Obstetrics & Gynecology

## 2017-01-15 ENCOUNTER — Encounter: Payer: Self-pay | Admitting: Obstetrics & Gynecology

## 2017-01-15 ENCOUNTER — Ambulatory Visit (INDEPENDENT_AMBULATORY_CARE_PROVIDER_SITE_OTHER): Payer: Self-pay | Admitting: Obstetrics & Gynecology

## 2017-01-15 VITALS — BP 117/73 | HR 60 | Ht 66.0 in | Wt 179.1 lb

## 2017-01-15 DIAGNOSIS — D251 Intramural leiomyoma of uterus: Secondary | ICD-10-CM

## 2017-01-15 DIAGNOSIS — R102 Pelvic and perineal pain: Secondary | ICD-10-CM

## 2017-01-15 DIAGNOSIS — N898 Other specified noninflammatory disorders of vagina: Secondary | ICD-10-CM

## 2017-01-15 NOTE — Progress Notes (Signed)
   Subjective:    Patient ID: Catherine Ingram, female    DOB: 03-12-1976, 41 y.o.   MRN: [id number]  HPI 41 yo SH P2 here for RLQ pain for 6 months and some dyspareunia for the last month. She has a h/o abdominal myomectomy 1/17 done for DUB.  She also reports vaginal itching for about 3 days, some white discharge.  She reports her periods to be 7 days and monthly   Review of Systems Pap normal 1/15    Objective:   Physical Exam Well nourished, well hydrated Hispanic female, no apparent distress Breathing, conversing, and ambulating normally Video interpretor used for this visit. Vaginal discharge white, possibly yeast Bimanual exam reveals a firm uterus, about 6 week size, c/w a new fibroid        Assessment & Plan:  Preventative care- rec pap smear this year. Info for free pap clinic given Pelvic pain- gyn u/s Vaginal itching- wet prep

## 2017-01-15 NOTE — Progress Notes (Signed)
Status interpreter [place] [phone].

## 2017-01-15 NOTE — Addendum Note (Signed)
Addended by: Wendelyn Breslow L on: 01/15/2017 11:07 AM   Modules accepted: Orders

## 2017-01-18 LAB — CERVICOVAGINAL ANCILLARY ONLY
BACTERIAL VAGINITIS: NEGATIVE
CANDIDA VAGINITIS: POSITIVE — AB

## 2017-01-22 ENCOUNTER — Other Ambulatory Visit: Payer: Self-pay

## 2017-01-22 NOTE — Telephone Encounter (Signed)
Called in a diflucin for patient

## 2017-01-22 NOTE — Telephone Encounter (Signed)
Called in diflucan for patient.

## 2017-01-28 ENCOUNTER — Ambulatory Visit (HOSPITAL_COMMUNITY)
Admission: RE | Admit: 2017-01-28 | Discharge: 2017-01-28 | Disposition: A | Discharge status: Home / Self Care | Payer: Self-pay | Source: Ambulatory Visit | Attending: Obstetrics & Gynecology | Admitting: Obstetrics & Gynecology

## 2017-01-28 DIAGNOSIS — R102 Pelvic and perineal pain: Secondary | ICD-10-CM | POA: Insufficient documentation

## 2017-02-17 ENCOUNTER — Telehealth: Payer: Self-pay

## 2017-02-17 NOTE — Telephone Encounter (Signed)
Called patient to inform her of results from her recent ultrasound. Left a message to call us back. Per Dr.Dove patient needs to have a colposcopy. Message sent to the admin to scheduled appointment with Dr.Dove.

## 2017-02-18 NOTE — Telephone Encounter (Signed)
Notified by front office patient calling back and request a call back.  I called Emri with Interpreter Lockie Mola and explained results of ultrasound and reccommendation to have colposcopy.  I explained registrars will call her with an appointment.  She voices understanding.

## 2017-02-18 NOTE — Telephone Encounter (Signed)
Called patient using interpreter(Carol) no answer left a message for patient to call us back. Per Dr.Dove she needs to have a colposcopy with Dove.

## 2017-03-15 ENCOUNTER — Ambulatory Visit: Payer: Self-pay | Admitting: Obstetrics & Gynecology

## 2017-04-06 ENCOUNTER — Telehealth: Payer: Self-pay | Admitting: General Practice

## 2017-04-06 NOTE — Telephone Encounter (Signed)
Patient called into front office wanting results & wanted to know if she needed a biopsy. [place] interpreter [phone] used for encounter. Informed patient of ultrasound results. Patient verbalized understanding & states she was told she needed a biopsy. Reviewed chart with Dr Hulan Fray who states patient does not need a biopsy. No pap was performed and patient was recommended to see free pap clinic. Discussed with patient. Patient states she lost their number. Provided free pap screening number to patient. Patient states that Dr Hulan Fray felt a fibroid or lump on her exam. Reviewed ultrasound report with patient and discussed that nothing needs to be done about the possible polyp seen and that Dr Hulan Fray isn't concerned about it. Patient verbalized understanding but requests follow up appt. Called transferred to [facility] to make patient an appt. Patient had no other questions for me.

## 2017-04-19 ENCOUNTER — Encounter: Payer: Self-pay | Admitting: Obstetrics & Gynecology

## 2017-04-19 ENCOUNTER — Telehealth: Payer: Self-pay | Admitting: Obstetrics & Gynecology

## 2017-04-19 ENCOUNTER — Ambulatory Visit (INDEPENDENT_AMBULATORY_CARE_PROVIDER_SITE_OTHER): Payer: Self-pay | Admitting: Obstetrics & Gynecology

## 2017-04-19 VITALS — BP 133/83 | HR 63 | Wt 181.9 lb

## 2017-04-19 DIAGNOSIS — N840 Polyp of corpus uteri: Secondary | ICD-10-CM

## 2017-04-19 DIAGNOSIS — K6289 Other specified diseases of anus and rectum: Secondary | ICD-10-CM

## 2017-04-19 MED ORDER — HYDROCORTISONE ACE-PRAMOXINE 1-1 % RE FOAM: 1.0000 | Freq: Two times a day (BID) | RECTAL | Status: AC

## 2017-04-19 MED ORDER — FLUCONAZOLE 150 MG PO TABS: 150.0000 mg | ORAL_TABLET | Freq: Once | ORAL | 3 refills | Status: AC

## 2017-04-19 NOTE — Telephone Encounter (Signed)
Pt came in the office today and seen Dr. Bing Ree to sonohysterogram- due to menstrual date is late for the 7-10 days after period..the patient need to make an appointment to [facility] radiology after her next  last menstrual period.

## 2017-04-19 NOTE — Progress Notes (Signed)
Echo

## 2017-04-19 NOTE — Progress Notes (Signed)
Patient ID: Catherine Ingram, female   DOB: 19-May-1976, 41 y.o.   MRN: [id number]  Chief Complaint  Patient presents with  . Vaginal lump    HPI Catherine Ingram is a 41 y.o. female. 41 yo SH P2 here for LLQ pain for 6 months and some dyspareunia for the last month. Although at her visit here  3 months ago, she reported the pain on the Right. I ordered an u/s that showed a small possible uterine polyp. She is quite concerned that she has cancer.  She has a h/o abdominal myomectomy 1/17 done for DUB.  She also reports vaginal itching for about 3 days, some white discharge. She was treated by a Dr. Posey Pronto 7/18 with flagyl and diflucan and then by me for a wet prep + for yeast (with diflucan).   She also reports that she has felt a "lump" up inside her rectum. Reports some mild pain in this area.  She reports her periods to be 7 days and monthly  HPI  Past Medical History:  Diagnosis Date  . Headache   . Hypertension   . Kidney stones     Past Surgical History:  Procedure Laterality Date  . APPENDECTOMY     2015  . MYOMECTOMY N/A 06/04/2015   Procedure: MYOMECTOMY;  Surgeon: Emily Filbert, MD;  Location: [facility];  Service: Gynecology;  Laterality: N/A;  . OVARIAN CYST REMOVAL    . WISDOM TOOTH EXTRACTION  01/2013    Family History  Problem Relation Age of Onset  . Hypertension Mother   . Diabetes Mother   . Hypertension Father   . Stroke Father     Social History Social History   Tobacco Use  . Smoking status: Former Smoker    Types: Cigarettes    Last attempt to quit: 06/25/1997    Years since quitting: 19.8  . Smokeless tobacco: Never Used  Substance Use Topics  . Alcohol use: No  . Drug use: No    No Known Allergies  Current Outpatient Medications  Medication Sig Dispense Refill  . amLODipine (NORVASC) 10 MG tablet Take 10 mg by mouth daily.    . hydrochlorothiazide (HYDRODIURIL) 25 MG tablet Take 12.5 mg by mouth daily.    . fluconazole (DIFLUCAN) 150  MG tablet Take 1 tablet (150 mg total) by mouth once. (Patient not taking: Reported on 04/19/2017) 1 tablet 1  . ibuprofen (ADVIL,MOTRIN) 800 MG tablet Take 1 tablet (800 mg total) by mouth every 8 (eight) hours as needed (mild pain). (Patient not taking: Reported on 01/15/2017) 30 tablet 0  . oxyCODONE-acetaminophen (PERCOCET/ROXICET) 5-325 MG tablet Take 1-2 tablets by mouth every 4 (four) hours as needed for severe pain (moderate to severe pain (when tolerating fluids)). (Patient not taking: Reported on 07/17/2015) 30 tablet 0   No current facility-administered medications for this visit.     Review of Systems Review of Systems  Blood pressure 133/83, pulse 63, weight 181 lb 14.4 oz (82.5 kg), last menstrual period 04/06/2017.  Physical Exam Physical Exam Interpretor used for this encounter Vaginal discharge not unusual Rectal exam reveals a very tiny internal hemorrhoid Data Reviewed IMPRESSION: No evidence of uterine fibroids.  Suspect polyp in the midportion of the endometrial cavity. Consider sonohysterogram for further evaluation, prior to hysteroscopy or endometrial biopsy.  Normal appearance of both ovaries.  No adnexal mass identified.  Assessment   rectal discomfort, possible small hemorrhoid Right or Left pelvic pain- ultrasound shows no gyn etiology of this pain Subjectively  abnormal vaginal discharge     Plan      SIS ordered Diflucan re prescribed Proctofoam prescribed RTC 1 month    Sharbel Sahagun C Jabir Dahlem 04/19/2017, 11:42 AM

## 2017-05-07 ENCOUNTER — Other Ambulatory Visit: Payer: Self-pay | Admitting: Obstetrics and Gynecology

## 2017-05-07 DIAGNOSIS — Z1231 Encounter for screening mammogram for malignant neoplasm of breast: Secondary | ICD-10-CM

## 2017-05-11 ENCOUNTER — Ambulatory Visit (HOSPITAL_COMMUNITY)
Admission: RE | Admit: 2017-05-11 | Discharge: 2017-05-11 | Disposition: A | Discharge status: Home / Self Care | Payer: Self-pay | Source: Ambulatory Visit | Attending: Obstetrics & Gynecology | Admitting: Obstetrics & Gynecology

## 2017-05-11 DIAGNOSIS — R9389 Abnormal findings on diagnostic imaging of other specified body structures: Secondary | ICD-10-CM | POA: Insufficient documentation

## 2017-05-11 DIAGNOSIS — N840 Polyp of corpus uteri: Secondary | ICD-10-CM

## 2017-06-03 ENCOUNTER — Encounter (HOSPITAL_COMMUNITY): Payer: Self-pay

## 2017-06-03 ENCOUNTER — Ambulatory Visit (HOSPITAL_COMMUNITY)
Admission: RE | Admit: 2017-06-03 | Discharge: 2017-06-03 | Disposition: A | Discharge status: Home / Self Care | Payer: Self-pay | Source: Ambulatory Visit | Attending: Obstetrics and Gynecology | Admitting: Obstetrics and Gynecology

## 2017-06-03 ENCOUNTER — Ambulatory Visit
Admission: RE | Admit: 2017-06-03 | Discharge: 2017-06-03 | Disposition: A | Discharge status: Home / Self Care | Payer: No Typology Code available for payment source | Source: Ambulatory Visit | Attending: Obstetrics and Gynecology | Admitting: Obstetrics and Gynecology

## 2017-06-03 VITALS — BP 120/82 | Temp 97.8°F | Ht 65.0 in | Wt 180.4 lb

## 2017-06-03 DIAGNOSIS — Z1231 Encounter for screening mammogram for malignant neoplasm of breast: Secondary | ICD-10-CM

## 2017-06-03 DIAGNOSIS — Z1239 Encounter for other screening for malignant neoplasm of breast: Secondary | ICD-10-CM

## 2017-06-03 NOTE — Progress Notes (Signed)
No complaints today.   Pap Smear: Pap smear not completed today. Last Pap smear was 06/21/2013 at [facility] and normal with negative HPV. Per patient has a history of an abnormal Pap smear in 2010 that showed HPV positive and a repeat Pap smear was completed. Patient stated all Pap smears have been normal since and that she has had at least three normal Pap smears. Last Pap smear result is in Epic.  Physical exam: Breasts Breasts symmetrical. No skin abnormalities bilateral breasts. No nipple retraction bilateral breasts. No nipple discharge bilateral breasts. No lymphadenopathy. No lumps palpated bilateral breasts. No complaints of pain or tenderness on exam. Referred patient to the [facility] for a screening mammogram. Appointment scheduled for Thursday, June 03, 2017 at 1540.        Pelvic/Bimanual No Pap smear completed today since last Pap smear and HPV typing was 06/21/2013. Pap smear not indicated per BCCCP guidelines.   Smoking History: Patient is a former smoker that quit 25 years ago.  Patient Navigation: Patient education provided. Access to services provided for patient through [facility] program. Spanish interpreter provided.  Used Spanish interpreter Sara Lee from [place].

## 2017-06-03 NOTE — Patient Instructions (Signed)
Explained breast self awareness with Catherine Ingram. Patient did not need a Pap smear today due to last Pap smear and HPV typing was 06/21/2016. Let her know BCCCP will cover Pap smears and HPV typing every 5 years unless has a history of abnormal Pap smears. Referred patient to the [facility] for a screening mammogram. Appointment scheduled for Thursday, June 03, 2017 at 1540. Let [facility] know the [facility] will follow up with her within the next couple weeks with results of mammogram by letter or phone. Catherine Ingram verbalized understanding.  Marit Goodwill, Arvil Chaco, RN 4:01 PM

## 2017-06-11 ENCOUNTER — Encounter (HOSPITAL_COMMUNITY): Payer: Self-pay | Admitting: *Deleted

## 2017-07-12 ENCOUNTER — Encounter (HOSPITAL_COMMUNITY): Payer: Self-pay

## 2017-07-12 ENCOUNTER — Encounter: Payer: Self-pay | Admitting: Obstetrics & Gynecology

## 2017-07-12 ENCOUNTER — Ambulatory Visit: Payer: Self-pay | Admitting: Obstetrics & Gynecology

## 2017-07-12 VITALS — BP 124/86 | HR 58 | Wt 178.6 lb

## 2017-07-12 DIAGNOSIS — G8929 Other chronic pain: Secondary | ICD-10-CM

## 2017-07-12 DIAGNOSIS — R102 Pelvic and perineal pain: Secondary | ICD-10-CM

## 2017-07-12 DIAGNOSIS — N840 Polyp of corpus uteri: Secondary | ICD-10-CM

## 2017-07-12 NOTE — Progress Notes (Signed)
   Subjective:    Patient ID: Catherine Ingram, female    DOB: January 30, 1976, 42 y.o.   MRN: [id number]  HPI 42 yo single Hispanic P2 (13 and 55 yo kids) here to discuss her HSG. She has not used contraception since 2013. She would like a pregnancy.   Review of Systems     Objective:   Physical Exam Breathing, conversing, and ambulating normally Well nourished, well hydrated White female, no apparent distress Live interpretor present for exam Abd- benign      Assessment & Plan:  Menorrhagia with uterine polyp and chronic pelvic pain- plan for hysteroscopy and diagnostic laparoscopy. I sent Drema Balzarine an email to schedule this.

## 2017-08-02 NOTE — Patient Instructions (Addendum)
Your procedure is scheduled on: Thursday August 12, 2017 at 11:30 am  Enter through the Main Entrance of [facility] at:10:00 am  Pick up the phone at the desk and dial [phone].  Call this number if you have problems the morning of surgery: [phone].  Remember: Do NOT eat food or drink any liquids after: Midnight Wednesday March 20  Take these medicines the morning of surgery with a SIP OF WATER: Norvasc, Prosaz  STOP ALL HERBAL SUPPLEMENTS AND VITAMINS 1 WEEK PRIOR TO SURGERY  DO NOT SMOKE DAY OF SURGERY  Do NOT wear jewelry (body piercing), metal hair clips/bobby pins, make-up, or nail polish. Do NOT wear lotions, powders, or perfumes.  You may wear deoderant. Do NOT shave for 48 hours prior to surgery. Do NOT bring valuables to the hospital. Contacts, dentures, or bridgework may not be worn into surgery.  Have a responsible adult drive you home and stay with you for 24 hours after your procedure

## 2017-08-04 ENCOUNTER — Encounter (HOSPITAL_COMMUNITY): Payer: Self-pay

## 2017-08-04 ENCOUNTER — Other Ambulatory Visit: Payer: Self-pay

## 2017-08-04 ENCOUNTER — Encounter (HOSPITAL_COMMUNITY)
Admission: RE | Admit: 2017-08-04 | Discharge: 2017-08-04 | Disposition: A | Discharge status: Home / Self Care | Payer: Self-pay | Source: Ambulatory Visit | Attending: Obstetrics & Gynecology | Admitting: Obstetrics & Gynecology

## 2017-08-04 DIAGNOSIS — Z01812 Encounter for preprocedural laboratory examination: Secondary | ICD-10-CM | POA: Insufficient documentation

## 2017-08-04 DIAGNOSIS — Z01818 Encounter for other preprocedural examination: Secondary | ICD-10-CM | POA: Insufficient documentation

## 2017-08-04 DIAGNOSIS — Z01810 Encounter for preprocedural cardiovascular examination: Secondary | ICD-10-CM | POA: Insufficient documentation

## 2017-08-04 HISTORY — DX: Depression, unspecified: F32.A

## 2017-08-04 HISTORY — DX: Major depressive disorder, single episode, unspecified: F32.9

## 2017-08-04 HISTORY — DX: Anemia, unspecified: D64.9

## 2017-08-04 LAB — CBC
HCT: 39.8 % (ref 36.0–46.0)
HEMOGLOBIN: 13.3 g/dL (ref 12.0–15.0)
MCH: 30.9 pg (ref 26.0–34.0)
MCHC: 33.4 g/dL (ref 30.0–36.0)
MCV: 92.6 fL (ref 78.0–100.0)
PLATELETS: 258 10*3/uL (ref 150–400)
RBC: 4.3 MIL/uL (ref 3.87–5.11)
RDW: 14 % (ref 11.5–15.5)
WBC: 8.7 10*3/uL (ref 4.0–10.5)

## 2017-08-04 LAB — BASIC METABOLIC PANEL
Anion gap: 9 (ref 5–15)
BUN: 22 mg/dL — AB (ref 6–20)
CHLORIDE: 101 mmol/L (ref 101–111)
CO2: 23 mmol/L (ref 22–32)
Calcium: 9.4 mg/dL (ref 8.9–10.3)
Creatinine, Ser: 0.83 mg/dL (ref 0.44–1.00)
GFR calc Af Amer: >60 mL/min (ref >60)
GFR calc non Af Amer: >60 mL/min (ref >60)
GLUCOSE: 84 mg/dL (ref 65–99)
POTASSIUM: 3.8 mmol/L (ref 3.5–5.1)
Sodium: 133 mmol/L — ABNORMAL LOW (ref 135–145)

## 2017-08-12 ENCOUNTER — Ambulatory Visit (HOSPITAL_COMMUNITY): Payer: Self-pay | Admitting: Anesthesiology

## 2017-08-12 ENCOUNTER — Ambulatory Visit (HOSPITAL_COMMUNITY)
Admission: AD | Admit: 2017-08-12 | Discharge: 2017-08-12 | Disposition: A | Discharge status: Home / Self Care | Payer: Self-pay | Source: Ambulatory Visit | Attending: Obstetrics & Gynecology | Admitting: Obstetrics & Gynecology

## 2017-08-12 ENCOUNTER — Encounter (HOSPITAL_COMMUNITY)
Admission: AD | Disposition: A | Discharge status: Home / Self Care | Payer: Self-pay | Source: Ambulatory Visit | Attending: Obstetrics & Gynecology

## 2017-08-12 ENCOUNTER — Encounter (HOSPITAL_COMMUNITY): Payer: Self-pay | Admitting: Anesthesiology

## 2017-08-12 DIAGNOSIS — Z87442 Personal history of urinary calculi: Secondary | ICD-10-CM | POA: Insufficient documentation

## 2017-08-12 DIAGNOSIS — N803 Endometriosis of pelvic peritoneum: Secondary | ICD-10-CM | POA: Insufficient documentation

## 2017-08-12 DIAGNOSIS — K660 Peritoneal adhesions (postprocedural) (postinfection): Secondary | ICD-10-CM | POA: Insufficient documentation

## 2017-08-12 DIAGNOSIS — N841 Polyp of cervix uteri: Secondary | ICD-10-CM

## 2017-08-12 DIAGNOSIS — R102 Pelvic and perineal pain: Secondary | ICD-10-CM

## 2017-08-12 DIAGNOSIS — G8929 Other chronic pain: Secondary | ICD-10-CM | POA: Insufficient documentation

## 2017-08-12 DIAGNOSIS — I10 Essential (primary) hypertension: Secondary | ICD-10-CM | POA: Insufficient documentation

## 2017-08-12 DIAGNOSIS — Z87891 Personal history of nicotine dependence: Secondary | ICD-10-CM | POA: Insufficient documentation

## 2017-08-12 DIAGNOSIS — N840 Polyp of corpus uteri: Secondary | ICD-10-CM | POA: Insufficient documentation

## 2017-08-12 DIAGNOSIS — Z79899 Other long term (current) drug therapy: Secondary | ICD-10-CM | POA: Insufficient documentation

## 2017-08-12 DIAGNOSIS — F329 Major depressive disorder, single episode, unspecified: Secondary | ICD-10-CM | POA: Insufficient documentation

## 2017-08-12 DIAGNOSIS — Q51810 Arcuate uterus: Secondary | ICD-10-CM | POA: Insufficient documentation

## 2017-08-12 DIAGNOSIS — N920 Excessive and frequent menstruation with regular cycle: Secondary | ICD-10-CM

## 2017-08-12 HISTORY — PX: LAPAROSCOPY: SHX197

## 2017-08-12 HISTORY — PX: LAPAROSCOPIC LYSIS OF ADHESIONS: SHX5905

## 2017-08-12 HISTORY — PX: DILATATION & CURETTAGE/HYSTEROSCOPY WITH MYOSURE: SHX6511

## 2017-08-12 LAB — PREGNANCY, URINE: PREG TEST UR: NEGATIVE

## 2017-08-12 SURGERY — LAPAROSCOPY, DIAGNOSTIC
Anesthesia: General | Site: Vagina

## 2017-08-12 MED ORDER — LIDOCAINE HCL (CARDIAC) 20 MG/ML IV SOLN
INTRAVENOUS | Status: AC
  Filled 2017-08-12: qty 5

## 2017-08-12 MED ORDER — FENTANYL CITRATE (PF) 100 MCG/2ML IJ SOLN
INTRAMUSCULAR | Status: DC | PRN
  Administered 2017-08-12: 50 ug via INTRAVENOUS
  Administered 2017-08-12 (×2): 100 ug via INTRAVENOUS

## 2017-08-12 MED ORDER — SCOPOLAMINE 1 MG/3DAYS TD PT72
MEDICATED_PATCH | TRANSDERMAL | Status: AC
  Administered 2017-08-12: 1.5 mg via TRANSDERMAL
  Filled 2017-08-12: qty 1

## 2017-08-12 MED ORDER — KETOROLAC TROMETHAMINE 30 MG/ML IJ SOLN
INTRAMUSCULAR | Status: AC
  Filled 2017-08-12: qty 1

## 2017-08-12 MED ORDER — FENTANYL CITRATE (PF) 250 MCG/5ML IJ SOLN
INTRAMUSCULAR | Status: AC
  Filled 2017-08-12: qty 5

## 2017-08-12 MED ORDER — GLYCOPYRROLATE 0.2 MG/ML IJ SOLN
INTRAMUSCULAR | Status: AC
  Filled 2017-08-12: qty 1

## 2017-08-12 MED ORDER — KETOROLAC TROMETHAMINE 30 MG/ML IJ SOLN
30.0000 mg | Freq: Once | INTRAMUSCULAR | Status: AC
Start: 2017-08-12 — End: 2017-08-12
  Administered 2017-08-12: 30 mg via INTRAVENOUS

## 2017-08-12 MED ORDER — MIDAZOLAM HCL 2 MG/2ML IJ SOLN
INTRAMUSCULAR | Status: DC | PRN
  Administered 2017-08-12: 2 mg via INTRAVENOUS

## 2017-08-12 MED ORDER — HYDROMORPHONE HCL 1 MG/ML IJ SOLN
0.2500 mg | INTRAMUSCULAR | Status: DC | PRN
  Administered 2017-08-12 (×2): 0.5 mg via INTRAVENOUS

## 2017-08-12 MED ORDER — OXYCODONE-ACETAMINOPHEN 5-325 MG PO TABS: 1.0000 | ORAL_TABLET | Freq: Four times a day (QID) | ORAL | 0 refills | Status: DC | PRN

## 2017-08-12 MED ORDER — BUPIVACAINE HCL (PF) 0.5 % IJ SOLN
INTRAMUSCULAR | Status: DC | PRN
  Administered 2017-08-12: 10 mL

## 2017-08-12 MED ORDER — ATROPINE SULFATE 0.4 MG/ML IJ SOLN
INTRAMUSCULAR | Status: AC
  Filled 2017-08-12: qty 1

## 2017-08-12 MED ORDER — PROPOFOL 10 MG/ML IV BOLUS
INTRAVENOUS | Status: AC
  Filled 2017-08-12: qty 20

## 2017-08-12 MED ORDER — LACTATED RINGERS IV SOLN
INTRAVENOUS | Status: DC
  Administered 2017-08-12 (×3): via INTRAVENOUS

## 2017-08-12 MED ORDER — DEXAMETHASONE SODIUM PHOSPHATE 4 MG/ML IJ SOLN
INTRAMUSCULAR | Status: DC | PRN
  Administered 2017-08-12: 4 mg via INTRAVENOUS

## 2017-08-12 MED ORDER — SUGAMMADEX SODIUM 200 MG/2ML IV SOLN
INTRAVENOUS | Status: AC
  Filled 2017-08-12: qty 2

## 2017-08-12 MED ORDER — MIDAZOLAM HCL 2 MG/2ML IJ SOLN
INTRAMUSCULAR | Status: AC
  Filled 2017-08-12: qty 2

## 2017-08-12 MED ORDER — ROCURONIUM BROMIDE 100 MG/10ML IV SOLN
INTRAVENOUS | Status: DC | PRN
  Administered 2017-08-12: 5 mg via INTRAVENOUS
  Administered 2017-08-12: 10 mg via INTRAVENOUS
  Administered 2017-08-12: 35 mg via INTRAVENOUS
  Administered 2017-08-12: 10 mg via INTRAVENOUS

## 2017-08-12 MED ORDER — MEPERIDINE HCL 25 MG/ML IJ SOLN: 6.2500 mg | INTRAMUSCULAR | Status: DC | PRN

## 2017-08-12 MED ORDER — GLYCOPYRROLATE 0.2 MG/ML IJ SOLN
INTRAMUSCULAR | Status: DC | PRN
  Administered 2017-08-12: .1 mg via INTRAVENOUS
  Administered 2017-08-12: 0.1 mg via INTRAVENOUS

## 2017-08-12 MED ORDER — PROPOFOL 10 MG/ML IV BOLUS
INTRAVENOUS | Status: DC | PRN
  Administered 2017-08-12: 170 mg via INTRAVENOUS

## 2017-08-12 MED ORDER — SUGAMMADEX SODIUM 200 MG/2ML IV SOLN
INTRAVENOUS | Status: DC | PRN
  Administered 2017-08-12: 160 mg via INTRAVENOUS

## 2017-08-12 MED ORDER — BUPIVACAINE HCL (PF) 0.5 % IJ SOLN
INTRAMUSCULAR | Status: AC
  Filled 2017-08-12: qty 30

## 2017-08-12 MED ORDER — ONDANSETRON HCL 4 MG/2ML IJ SOLN
INTRAMUSCULAR | Status: AC
Start: 2017-08-12 — End: ?
  Filled 2017-08-12: qty 2

## 2017-08-12 MED ORDER — ONDANSETRON HCL 4 MG/2ML IJ SOLN
INTRAMUSCULAR | Status: DC | PRN
  Administered 2017-08-12: 4 mg via INTRAVENOUS

## 2017-08-12 MED ORDER — ATROPINE SULFATE 0.4 MG/ML IJ SOLN
INTRAMUSCULAR | Status: DC | PRN
  Administered 2017-08-12: 0.4 mg via INTRAVENOUS

## 2017-08-12 MED ORDER — PROMETHAZINE HCL 25 MG/ML IJ SOLN
6.2500 mg | INTRAMUSCULAR | Status: DC | PRN
Start: 2017-08-12 — End: 2017-08-12

## 2017-08-12 MED ORDER — LACTATED RINGERS IV SOLN: INTRAVENOUS | Status: DC

## 2017-08-12 MED ORDER — HYDROMORPHONE HCL 1 MG/ML IJ SOLN
INTRAMUSCULAR | Status: AC
  Filled 2017-08-12: qty 1

## 2017-08-12 MED ORDER — SCOPOLAMINE 1 MG/3DAYS TD PT72
1.0000 | MEDICATED_PATCH | Freq: Once | TRANSDERMAL | Status: DC
  Administered 2017-08-12: 1.5 mg via TRANSDERMAL

## 2017-08-12 MED ORDER — LIDOCAINE HCL (CARDIAC) 20 MG/ML IV SOLN
INTRAVENOUS | Status: DC | PRN
  Administered 2017-08-12: 60 mg via INTRAVENOUS

## 2017-08-12 MED ORDER — ROCURONIUM BROMIDE 100 MG/10ML IV SOLN
INTRAVENOUS | Status: AC
  Filled 2017-08-12: qty 1

## 2017-08-12 MED ORDER — DEXAMETHASONE SODIUM PHOSPHATE 4 MG/ML IJ SOLN
INTRAMUSCULAR | Status: AC
  Filled 2017-08-12: qty 1

## 2017-08-12 SURGICAL SUPPLY — 43 items
APPLICATOR COTTON TIP 6IN STRL (MISCELLANEOUS) ×4 IMPLANT
CABLE HIGH FREQUENCY MONO STRZ (ELECTRODE) IMPLANT
CATH ROBINSON RED A/P 16FR (CATHETERS) ×4 IMPLANT
CLOSURE WOUND 1/2 X4 (GAUZE/BANDAGES/DRESSINGS) ×2
DEVICE MYOSURE LITE (MISCELLANEOUS) IMPLANT
DEVICE MYOSURE REACH (MISCELLANEOUS) IMPLANT
DRSG COVADERM PLUS 2X2 (GAUZE/BANDAGES/DRESSINGS) ×8 IMPLANT
DRSG OPSITE POSTOP 3X4 (GAUZE/BANDAGES/DRESSINGS) IMPLANT
DURAPREP 26ML APPLICATOR (WOUND CARE) ×4 IMPLANT
ELECT REM PT RETURN 9FT ADLT (ELECTROSURGICAL) ×4
ELECTRODE REM PT RTRN 9FT ADLT (ELECTROSURGICAL) ×2 IMPLANT
FILTER ARTHROSCOPY CONVERTOR (FILTER) ×4 IMPLANT
GLOVE BIO SURGEON STRL SZ 6.5 (GLOVE) ×6 IMPLANT
GLOVE BIO SURGEONS STRL SZ 6.5 (GLOVE) ×2
GLOVE BIOGEL PI IND STRL 7.0 (GLOVE) ×4 IMPLANT
GLOVE BIOGEL PI INDICATOR 7.0 (GLOVE) ×4
GOWN STRL REUS W/TWL LRG LVL3 (GOWN DISPOSABLE) ×8 IMPLANT
NDL SAFETY ECLIPSE 18X1.5 (NEEDLE) ×2 IMPLANT
NEEDLE HYPO 18GX1.5 SHARP (NEEDLE) ×2
NEEDLE INSUFFLATION 120MM (ENDOMECHANICALS) ×4 IMPLANT
NEEDLE SPNL 18GX3.5 QUINCKE PK (NEEDLE) ×4 IMPLANT
NS IRRIG 1000ML POUR BTL (IV SOLUTION) ×4 IMPLANT
PACK LAPAROSCOPY BASIN (CUSTOM PROCEDURE TRAY) ×4 IMPLANT
PACK TRENDGUARD 450 HYBRID PRO (MISCELLANEOUS) ×2 IMPLANT
PACK VAGINAL MINOR WOMEN LF (CUSTOM PROCEDURE TRAY) ×4 IMPLANT
PAD OB MATERNITY 4.3X12.25 (PERSONAL CARE ITEMS) ×4 IMPLANT
POUCH SPECIMEN RETRIEVAL 10MM (ENDOMECHANICALS) IMPLANT
PROTECTOR NERVE ULNAR (MISCELLANEOUS) ×8 IMPLANT
SEAL ROD LENS SCOPE MYOSURE (ABLATOR) ×4 IMPLANT
SET IRRIG TUBING LAPAROSCOPIC (IRRIGATION / IRRIGATOR) IMPLANT
SHEARS HARMONIC ACE PLUS 36CM (ENDOMECHANICALS) ×4 IMPLANT
SLEEVE XCEL OPT CAN 5 100 (ENDOMECHANICALS) ×4 IMPLANT
STRIP CLOSURE SKIN 1/2X4 (GAUZE/BANDAGES/DRESSINGS) ×6 IMPLANT
SUT VICRYL 0 UR6 27IN ABS (SUTURE) IMPLANT
SUT VICRYL 4-0 PS2 18IN ABS (SUTURE) ×4 IMPLANT
SYR 30ML LL (SYRINGE) ×4 IMPLANT
TOWEL OR 17X24 6PK STRL BLUE (TOWEL DISPOSABLE) ×8 IMPLANT
TRENDGUARD 450 HYBRID PRO PACK (MISCELLANEOUS) ×4
TROCAR OPTI TIP 5M 100M (ENDOMECHANICALS) ×4 IMPLANT
TROCAR XCEL NON-BLD 11X100MML (ENDOMECHANICALS) IMPLANT
TUBING AQUILEX INFLOW (TUBING) ×4 IMPLANT
TUBING AQUILEX OUTFLOW (TUBING) ×4 IMPLANT
WARMER LAPAROSCOPE (MISCELLANEOUS) ×4 IMPLANT

## 2017-08-12 NOTE — H&P (Signed)
Catherine Ingram is an 42 y.o. female. Catherine Ingram is a 42 y.o. female. 42 yo SH P2 here for LLQ painfor 6 months and some dyspareunia for the last month. Although at her visit here  3 months ago, she reported the pain on the Right. I ordered an u/s that showed a small possible uterine polyp. She is quite concerned that she has cancer.  She has a h/o abdominal myomectomy 1/17 done for DUB.     Patient's last menstrual period was 08/11/2017 (exact date).    Past Medical History:  Diagnosis Date  . Anemia   . Depression   . Headache   . Hypertension   . Kidney stones     Past Surgical History:  Procedure Laterality Date  . APPENDECTOMY     2015  . MYOMECTOMY N/A 06/04/2015   Procedure: MYOMECTOMY;  Surgeon: Emily Filbert, MD;  Location: [facility];  Service: Gynecology;  Laterality: N/A;  . OVARIAN CYST REMOVAL    . WISDOM TOOTH EXTRACTION  01/2013    Family History  Problem Relation Age of Onset  . Hypertension Mother   . Diabetes Mother   . Hypertension Father   . Stroke Father   . Breast cancer Neg Hx     Social History:  reports that she quit smoking about 20 years ago. Her smoking use included cigarettes. She has never used smokeless tobacco. She reports that she does not drink alcohol or use drugs.  Allergies: No Known Allergies  Facility-Administered Medications Prior to Admission  Medication Dose Route Frequency Provider Last Rate Last Dose  . hydrocortisone-pramoxine (PROCTOFOAM-HC) rectal foam 1 applicator  1 applicator Rectal BID Sommer Spickard C, MD       Medications Prior to Admission  Medication Sig Dispense Refill Last Dose  . acetaminophen (TYLENOL) 325 MG tablet Take 650 mg by mouth every 6 (six) hours as needed (for pain.).   Past Week at Unknown time  . ALLERGY 10 MG tablet Take 10 mg by mouth daily.  99 Past Week at Unknown time  . amLODipine (NORVASC) 10 MG tablet Take 10 mg by mouth daily with breakfast.    08/12/2017 at 0900  .  hydrochlorothiazide (HYDRODIURIL) 25 MG tablet Take 25 mg by mouth daily.    Past Week at 0900  . lisinopril (PRINIVIL,ZESTRIL) 10 MG tablet Take 10 mg by mouth daily.  99 Past Week at Unknown time  . PROZAC 20 MG capsule Take 20 mg by mouth 3 (three) times a week.  99 08/12/2017 at 0900  . Pseudoeph-Doxylamine-DM-APAP (NYQUIL PO) Take 30 mLs by mouth at bedtime as needed (for cold symptoms.).   Past Week at Unknown time    ROS Homemaker Wants a baby, not using contraception  Blood pressure (!) 147/95, pulse 62, temperature 98.4 F (36.9 C), temperature source Oral, resp. rate 16, last menstrual period 08/11/2017, SpO2 100 %. Physical Exam  Heart- rrr Lungs- CTAB Benign  Results for orders placed or performed during the hospital encounter of 08/12/17 (from the past 24 hour(s))  Pregnancy, urine     Status: None   Collection Time: 08/12/17 10:00 AM  Result Value Ref Range   Preg Test, Ur NEGATIVE NEGATIVE    No results found.  Assessment/Plan: Uterine polyp, bleeding- plan for d&c, possible myosure for removal of polyp Chronic pain- plan for laparoscopy  She understands the risks of surgery, including, but not to infection, bleeding, DVTs, damage to bowel, bladder, ureters. She wishes to proceed.  Emily Filbert 08/12/2017, 10:35 AM

## 2017-08-12 NOTE — Anesthesia Postprocedure Evaluation (Signed)
Anesthesia Post Note  Patient: Catherine Ingram  Procedure(s) Performed: LAPAROSCOPY DIAGNOSTIC WITH PERITONEAL BIOPSIES (N/A Abdomen) DILATATION & CURETTAGE/HYSTEROSCOPY WITH MYOSURE (N/A Vagina ) LAPAROSCOPIC LYSIS OF ADHESIONS (N/A Abdomen)     Patient location during evaluation: PACU Anesthesia Type: General Level of consciousness: awake and alert Pain management: pain level controlled Vital Signs Assessment: post-procedure vital signs reviewed and stable Respiratory status: spontaneous breathing, nonlabored ventilation, respiratory function stable and patient connected to nasal cannula oxygen Cardiovascular status: blood pressure returned to baseline and stable Postop Assessment: no apparent nausea or vomiting Anesthetic complications: no    Last Vitals:  Vitals:   08/12/17 1400 08/12/17 1446  BP: 123/86 133/86  Pulse: (!) 59 67  Resp: 16 18  Temp: 36.8 C   SpO2: 100% 100%    Last Pain:  Vitals:   08/12/17 1446  TempSrc:   PainSc: 3    Pain Goal: Patients Stated Pain Goal: 4 (08/12/17 1001)               Effie Berkshire

## 2017-08-12 NOTE — Op Note (Signed)
08/12/2017  12:31 PM  PATIENT:  Catherine Ingram  42 y.o. female  PRE-OPERATIVE DIAGNOSIS: menorrhagia, polyp, pelvic pain Polyp Pelvic Pain  POST-OPERATIVE DIAGNOSIS:  Pelvic adhesion, Stage 1 endometriosis, menorrhagia, arcuate uterus  PROCEDURE:  DIAGNOSTIC LAPAROSCOPY WITH PERITONEAL BIOPSIES, LYSIS OF ADHESIONS, HYSTEROSCOPY, DILATION AND CURETTAGE  SURGEON:  Surgeon(s) and Role:    * Tristyn Pharris C, MD - Primary  ANESTHESIA:   local and general  EBL:  10 mL   BLOOD ADMINISTERED:none  DRAINS: none   LOCAL MEDICATIONS USED:  MARCAINE     SPECIMEN:  Source of Specimen:  Peritoneal biopsy from posterior cul de sac, uterine curettings  DISPOSITION OF SPECIMEN:  PATHOLOGY  COUNTS:  YES  TOURNIQUET:  * No tourniquets in log *  DICTATION: .Dragon Dictation  PLAN OF CARE: Discharge to home after PACU  PATIENT DISPOSITION:  PACU - hemodynamically stable.   Delay start of Pharmacological VTE agent (>24hrs) due to surgical blood loss or risk of bleeding: not applicable  The risks, benefits, and alternatives of surgery were explained, understood, accepted. In the operating room she was placed in the dorsal lithotomy position, and general anesthesia was given without complication. Her abdomen and vagina were prepped and draped in the usual sterile fashion. A timeout procedure was done. A bimanual exam revealed a small anteverted uterus. Her adnexa felt normal. A Hulka manipulator was placed.  Gloves were changed, and attention was turned to the abdomen. Approximately the 5 mL of 0.5% Marcaine was injected into the skin in the left upper quadrant after an OG tube was placed.  An incision was made at the site. A varies needle was placed intraperitoneally. Low-flow CO2 was used to insufflate the abdomen to approximately 3-1/2 L. Once a good pneumoperitoneum was established, a 5 mm Excel trocar was placed. Laparoscopy confirmed correct placement. A 5 mm port was placed in the  left lower quadrant under direct laparoscopic visualization after injecting 0.5% Marcaine in the incision site. Her upper abdomen appeared normal. The omentum was densely adherent to the anterior abdominal wall. I placed a 5 mm port in the left lower quadrant after injecting marcaine in the skin. A Harmonic scapel was used to hemostatically release the adhesions. I then was able to see that the anterior wall of the uterus was adherent to the anterior abdominal wall. I used the harmonic scalpel to release those adhesions as well. The ovaries appeared normal. The right oviduct was normal but the fimbriae of the left tube was adherent to omentum. I used the harmonic scalpel to release those adhesions as well. I inspected the pelvis for further causes of pelvic pain and found obvious endometriosis in the posterior cul de sac. I used biopsy forceps to remove the endometriosis. The biopsy site was hemostatic.  I then placed the camera in the left lower quadrant port and inspected the left upper quadrant port site. There were no adhesions in this area. Hemostasis was noted. The CO2 was allowed to escape from the abdomen.  I removed the 5 mm ports and noted hemostasis.  A subcuticular closure was done with 4-0 Vicryl suture at all incision sites. A Steri-Strip was placed across each incision.  I then proceeded with the hysteroscopy and d&c.      I grasped the cervix with a single tooth tenaculum after removing the Hulka manipulator. Her uterus sounded to 10 cm. Her cervix was dilated enough to accomodate a #6 dilator . Hysteroscopy showed a shaggy endometrium. No polyps were noted.  Both ostia were visualized. The uterus had an arcuate shape.  A curettage was done in all quadrants and the fundus of the uterus. A moderate amount of polypoid-type  tissue was obtained. A gritty sensation was appreciated throughout. There was no bleeding noted at the end of the case. She was taken to the recovery room after being  extubated. She tolerated the procedure well.

## 2017-08-12 NOTE — Transfer of Care (Signed)
Immediate Anesthesia Transfer of Care Note  Patient: Catherine Ingram  Procedure(s) Performed: LAPAROSCOPY DIAGNOSTIC WITH PERITONEAL BIOPSIES (N/A Abdomen) DILATATION & CURETTAGE/HYSTEROSCOPY WITH MYOSURE (N/A Vagina ) LAPAROSCOPIC LYSIS OF ADHESIONS (N/A Abdomen)  Patient Location: PACU  Anesthesia Type:General  Level of Consciousness: awake, alert  and oriented  Airway & Oxygen Therapy: Patient Spontanous Breathing and Patient connected to nasal cannula oxygen  Post-op Assessment: Report given to RN and Post -op Vital signs reviewed and stable  Post vital signs: Reviewed and stable  Last Vitals:  Vitals Value Taken Time  BP 140/102 08/12/2017 12:33 PM  Temp    Pulse 82 08/12/2017 12:34 PM  Resp 16 08/12/2017 12:34 PM  SpO2 100 % 08/12/2017 12:34 PM  Vitals shown include unvalidated device data.  Last Pain:  Vitals:   08/12/17 1001  TempSrc: Oral      Patients Stated Pain Goal: 4 (78/29/56 2130)  Complications: No apparent anesthesia complications

## 2017-08-12 NOTE — Discharge Instructions (Signed)
Dilatacin y curetaje o curetaje por aspiracin, cuidados posteriores (Dilation and Curettage or Vacuum Curettage, Care After) Estas indicaciones le proporcionan informacin acerca de cmo deber cuidarse despus del procedimiento. El mdico tambin podr darle instrucciones especficas. Comunquese con el mdico si tiene algn problema o tiene preguntas despus del procedimiento. CUIDADOS EN EL HOGAR  No conduzca durante 24horas.  Espere 1 semana antes de realizar [employer] la [employer].  Savage (2) veces al da, durante 4 [place]. Antelas. Comunquese con su mdico si tiene fiebre.  No permanezca de pie durante mucho tiempo.  No levante, empuje ni jale objetos de ms de 10 libras (4,5 kilogramos).  Solo suba escaleras una o dos veces por da.  Descanse con frecuencia.  Siga con su dieta habitual.  Beba suficiente lquido para mantener el pis (orina) claro o de color amarillo plido.  Si tiene dificultad para Administrator, sports (estreimiento), puede hacer lo siguiente: ? Tome algn medicamento que la ayude a Film/video editor intestino (laxante) segn las indicaciones de su mdico. ? Consuma ms fruta y salvado. ? Beba ms lquidos.  [employer] ducha, no un bao de inmersin, durante el tiempo que le indique su mdico.  No practique natacin ni use el jacuzzi hasta que el mdico la autorice.  Pdale a alguien que se quede con usted durante 1 o 2das despus del procedimiento.  No se haga duchas vaginales, no use tampones ni tenga sexo (relaciones sexuales) durante 2 semanas.  Solo tome los [employer] le haya indicado su mdico. No tome aspirina. Puede ocasionar hemorragias.  Cumpla con los controles mdicos.  SOLICITE AYUDA SI:  Tiene clicos o siente un dolor que no puede controlar con los medicamentos.  Siente dolor en el vientre (abdomen).  Advierte un olor ftido que proviene de la vagina.  Tiene una erupcin cutnea.  Tiene problemas con  los medicamentos.  SOLICITE AYUDA DE INMEDIATO SI:  Tiene una hemorragia ms abundante que un perodo normal.  Tiene fiebre.  Siente dolor en el pecho.  Tiene dificultad para respirar.  Se siente mareada o siente como si fuera a desmayarse (vahdo).  Se desmaya.  Siente dolor en la zona superior de los hombros.  Tiene una hemorragia vaginal, con o sin grumos de sangre (cogulos sanguneos).  ASEGRESE DE QUE:  Comprende estas instrucciones.  Controlar su afeccin.  Recibir ayuda de inmediato si no mejora o si empeora.  Esta informacin no tiene Marine scientist el consejo del mdico. Asegrese de hacerle al mdico cualquier pregunta que tenga. Document Released: 01/07/2011 Document Revised: 05/16/2013 Document Reviewed: 12/08/2012 Elsevier Interactive Patient Education  2017 [place] de diagnstico (Diagnostic Laparoscopy) La laparoscopia de diagnstico es un procedimiento que se hace para diagnosticar enfermedades en el abdomen. Durante su realizacin, se introduce en el abdomen un instrumento delgado del tamao de un lpiz que tiene [place] luz, llamado laparoscopio, a travs de una incisin. El laparoscopio le permite al mdico observar los rganos internos. INFORME A SU MDICO:  Cualquier alergia que tenga.  Todos los [employer], incluidos vitaminas, hierbas, gotas oftlmicas, cremas y medicamentos de venta libre.  Problemas previos que usted o los [employer] de su familia hayan tenido con el uso de anestsicos.  Enfermedades de la sangre que tenga.  Si tiene cirugas previas.  Enfermedades que tenga. RIESGOS Y COMPLICACIONES En general, se trata de un procedimiento seguro. Sin embargo, es posible que haya problemas, que pueden incluir lo siguiente:  Infeccin.  Hemorragia.  Lesiones  en los rganos circundantes.  Reaccin alrgica a la anestesia usada durante el procedimiento. ANTES DEL PROCEDIMIENTO  No coma ni beba nada  despus de la medianoche anterior al procedimiento o segn lo que le haya indicado el mdico.  Consulte a su mdico acerca de estos temas: ? Cambiar o suspender los medicamentos que toma habitualmente. ? Tomar medicamentos, como aspirina e ibuprofeno. Estos medicamentos pueden tener un efecto anticoagulante en la [place]. No tome estos medicamentos antes del procedimiento si el mdico le indica que no lo haga.  Haga planes para que una persona lo lleve de vuelta a su casa despus del procedimiento. PROCEDIMIENTO  Le administrarn un medicamento para ayudarlo a relajarse (sedante).  Le administrarn un medicamento que lo har dormir (anestesia general).  Se inflar el abdomen con un gas, lo que facilitar la observacin.  Le practicarn incisiones pequeas en el abdomen.  A travs de las incisiones, se introducirn un laparoscopio y otros instrumentos pequeos en el abdomen.  Se puede tomar Truddie Coco de tejido de un rgano del abdomen para su anlisis.  Se retirarn los instrumentos del abdomen.  Se extraer el gas.  Las incisiones se cerrarn con puntos (suturas). DESPUS DEL PROCEDIMIENTO Le controlarn con frecuencia la presin arterial, la frecuencia cardaca, la frecuencia respiratoria y Retail buyer de oxgeno en la sangre hasta que haya desaparecido el efecto de los medicamentos administrados. Esta informacin no tiene Marine scientist el consejo del mdico. Asegrese de hacerle al mdico cualquier pregunta que tenga. Document Released: 05/11/2005 Document Revised: 06/01/2014 Document Reviewed: 12/22/2013 Elsevier Interactive Patient Education  2018 [employer]. Dilation and Curettage or Vacuum Curettage, Care After This sheet gives you information about how to care for yourself after your procedure. Your health care provider may also give you more specific instructions. If you have problems or questions, contact your health care provider. What can I expect after the  procedure? After your procedure, it is common to have:  Mild pain or cramping.  Some vaginal bleeding or spotting.  These may last for up to 2 weeks after your procedure. Follow these instructions at home: Activity   Do not drive or use heavy machinery while taking prescription pain medicine.  Avoid driving for the first 24 hours after your procedure.  Take frequent, short walks, followed by rest periods, throughout the day. Ask your health care provider what activities are safe for you. After 1-2 days, you may be able to return to your normal activities.  Do not lift anything heavier than 10 lb (4.5 kg) until your health care provider approves.  For at least 2 weeks, or as long as told by your health care provider, do not: ? Douche. ? Use tampons. ? Have sexual intercourse. General instructions   Take over-the-counter and prescription medicines only as told by your health care provider. This is especially important if you take blood thinning medicine.  Do not take baths, swim, or use a hot tub until your health care provider approves. Take showers instead of baths.  Wear compression stockings as told by your health care provider. These stockings help to prevent blood clots and reduce swelling in your legs.  It is your responsibility to get the results of your procedure. Ask your health care provider, or the department performing the procedure, when your results will be ready.  Keep all follow-up visits as told by your health care provider. This is important. Contact a health care provider if:  You have severe cramps that get worse  or that do not get better with medicine.  You have severe abdominal pain.  You cannot drink fluids without vomiting.  You develop pain in a different area of your pelvis.  You have bad-smelling vaginal discharge.  You have a rash. Get help right away if:  You have vaginal bleeding that soaks more than one sanitary pad in 1 hour, for 2  hours in a row.  You pass large blood clots from your vagina.  You have a fever that is above 100.11F (38.0C).  Your abdomen feels very tender or hard.  You have chest pain.  You have shortness of breath.     Post Anesthesia Home Care Instructions  Activity: Get plenty of rest for the remainder of the day. A responsible individual must stay with you for 24 hours following the procedure.  For the next 24 hours, DO NOT: -Drive a car -Paediatric nurse -Drink alcoholic beverages -Take any medication unless instructed by your physician -Make any legal decisions or sign important papers.  Meals: Start with liquid foods such as gelatin or soup. Progress to regular foods as tolerated. Avoid greasy, spicy, heavy foods. If nausea and/or vomiting occur, drink only clear liquids until the nausea and/or vomiting subsides. Call your physician if vomiting continues.  Special Instructions/Symptoms: Your throat may feel dry or sore from the anesthesia or the breathing tube placed in your throat during surgery. If this causes discomfort, gargle with warm salt water. The discomfort should disappear within 24 hours.  If you had a scopolamine patch placed behind your ear for the management of post- operative nausea and/or vomiting:  1. The medication in the patch is effective for 72 hours, after which it should be removed.  Wrap patch in a tissue and discard in the trash. Wash hands thoroughly with soap and water. 2. You may remove the patch earlier than 72 hours if you experience unpleasant side effects which may include dry mouth, dizziness or visual disturbances. 3. Avoid touching the patch. Wash your hands with soap and water after contact with the patch.     You cough up blood.  You feel dizzy or light-headed.  You faint.  You have pain in your neck or shoulder area. This information is not intended to replace advice given to you by your health care provider. Make sure you discuss  any questions you have with your health care provider. Document Released: 05/08/2000 Document Revised: 01/08/2016 Document Reviewed: 12/12/2015 Elsevier Interactive Patient Education  [employer].

## 2017-08-12 NOTE — Anesthesia Procedure Notes (Signed)
Procedure Name: Intubation Date/Time: 08/12/2017 11:18 AM Performed by: Elenore Paddy, CRNA Pre-anesthesia Checklist: Patient identified, Emergency Drugs available, Suction available, Patient being monitored and Timeout performed Patient Re-evaluated:Patient Re-evaluated prior to induction Oxygen Delivery Method: Circle system utilized Preoxygenation: Pre-oxygenation with 100% oxygen Induction Type: IV induction Ventilation: Mask ventilation without difficulty Laryngoscope Size: Mac and 3 (Intubation by Delilah Shan) Grade View: Grade I Tube type: Oral Tube size: 7.0 mm Placement Confirmation: ETT inserted through vocal cords under direct vision,  positive ETCO2,  CO2 detector and breath sounds checked- equal and bilateral Secured at: 22 cm Tube secured with: Tape Dental Injury: Teeth and Oropharynx as per pre-operative assessment

## 2017-08-12 NOTE — Anesthesia Preprocedure Evaluation (Addendum)
Anesthesia Evaluation  Patient identified by MRN, date of birth, ID band Patient awake    Reviewed: Allergy & Precautions, NPO status , Patient's Chart, lab work & pertinent test results  Airway Mallampati: I  TM Distance: >3 FB Neck ROM: Full    Dental  (+) Teeth Intact, Dental Advisory Given   Pulmonary former smoker,    breath sounds clear to auscultation       Cardiovascular hypertension, Pt. on medications  Rhythm:Regular Rate:Normal     Neuro/Psych  Headaches, PSYCHIATRIC DISORDERS Depression    GI/Hepatic negative GI ROS, Neg liver ROS,   Endo/Other  negative endocrine ROS  Renal/GU negative Renal ROS     Musculoskeletal negative musculoskeletal ROS (+)   Abdominal Normal abdominal exam  (+)   Peds  Hematology negative hematology ROS (+)   Anesthesia Other Findings   Reproductive/Obstetrics negative OB ROS                            Lab Results  Component Value Date   WBC 8.7 08/04/2017   HGB 13.3 08/04/2017   HCT 39.8 08/04/2017   MCV 92.6 08/04/2017   PLT 258 08/04/2017   Lab Results  Component Value Date   CREATININE 0.83 08/04/2017   BUN 22 (H) 08/04/2017   NA 133 (L) 08/04/2017   K 3.8 08/04/2017   CL 101 08/04/2017   CO2 23 08/04/2017   No results found for: INR, PROTIME  EKG: normal sinus rhythm.  Anesthesia Physical Anesthesia Plan  ASA: II  Anesthesia Plan: General   Post-op Pain Management:    Induction: Intravenous  PONV Risk Score and Plan: 3 and Ondansetron, Propofol infusion and Midazolam  Airway Management Planned: Oral ETT  Additional Equipment: None  Intra-op Plan:   Post-operative Plan: Extubation in OR  Informed Consent: I have reviewed the patients History and Physical, chart, labs and discussed the procedure including the risks, benefits and alternatives for the proposed anesthesia with the patient or authorized representative who  has indicated his/her understanding and acceptance.   Dental advisory given  Plan Discussed with: CRNA  Anesthesia Plan Comments: (Spanish interpreter. Pt declined spinal. )       Anesthesia Quick Evaluation

## 2017-08-13 ENCOUNTER — Encounter (HOSPITAL_COMMUNITY): Payer: Self-pay | Admitting: Obstetrics & Gynecology

## 2017-09-17 ENCOUNTER — Ambulatory Visit (INDEPENDENT_AMBULATORY_CARE_PROVIDER_SITE_OTHER): Payer: Self-pay | Admitting: Obstetrics & Gynecology

## 2017-09-17 ENCOUNTER — Encounter: Payer: Self-pay | Admitting: Obstetrics & Gynecology

## 2017-09-17 VITALS — BP 105/77 | HR 72 | Ht 66.0 in | Wt 172.3 lb

## 2017-09-17 DIAGNOSIS — Z124 Encounter for screening for malignant neoplasm of cervix: Secondary | ICD-10-CM

## 2017-09-17 DIAGNOSIS — R5383 Other fatigue: Secondary | ICD-10-CM

## 2017-09-17 DIAGNOSIS — Z1151 Encounter for screening for human papillomavirus (HPV): Secondary | ICD-10-CM

## 2017-09-17 DIAGNOSIS — Z9889 Other specified postprocedural states: Secondary | ICD-10-CM

## 2017-09-17 MED ORDER — LEVONORGEST-ETH ESTRAD 91-DAY 0.15-0.03 &0.01 MG PO TABS: 1.0000 | ORAL_TABLET | Freq: Every day | ORAL | 4 refills | Status: DC

## 2017-09-17 NOTE — Progress Notes (Signed)
   Subjective:    Patient ID: Catherine Ingram, female    DOB: April 28, 1976, 42 y.o.   MRN: [id number]  HPI  3 yosingle Hispanic P2 here for a post op visit after having a d&c, hysteroscopy, diagnostic laparoscopy, removal of classic endometriosis and lysis of adhesions (probably from a previous appendectomy). She reports that the pain is better. She has not had sex since her surgery.   She feels fatigue.  Review of Systems She currently uses condoms for contraception.    Objective:   Physical Exam Breathing, conversing, and ambulating normally Well nourished, well hydrated Hispanic female, no apparent distress Live interpretor present for exam Cervix appears normal Incisions healed well Abd- benign     Assessment & Plan:   Stage 1 endometriosis- improved since surgery She will start Camrese with her NMP (extended cycle pills) Come back 3 months Check TSH

## 2017-09-18 LAB — TSH: TSH: 1.34 u[IU]/mL (ref 0.450–4.500)

## 2017-09-21 LAB — CYTOLOGY - PAP
Diagnosis: NEGATIVE
HPV: NOT DETECTED

## 2017-09-29 ENCOUNTER — Telehealth: Payer: Self-pay | Admitting: Family Medicine

## 2017-09-29 DIAGNOSIS — R102 Pelvic and perineal pain: Secondary | ICD-10-CM

## 2017-09-29 DIAGNOSIS — B373 Candidiasis of vulva and vagina: Secondary | ICD-10-CM

## 2017-09-29 DIAGNOSIS — B3731 Acute candidiasis of vulva and vagina: Secondary | ICD-10-CM

## 2017-09-29 DIAGNOSIS — IMO0001 Reserved for inherently not codable concepts without codable children: Secondary | ICD-10-CM

## 2017-09-29 NOTE — Telephone Encounter (Signed)
Patient is requesting a different medication for her Cyst and would like the Rx sent to [facility] [place] on [address]. Phone number:[phone].  The one she was given was to Expensive.

## 2017-09-30 MED ORDER — FLUCONAZOLE 150 MG PO TABS: 150.0000 mg | ORAL_TABLET | Freq: Once | ORAL | 0 refills | Status: AC

## 2017-09-30 NOTE — Telephone Encounter (Signed)
I called Catherine Ingram with Interpreter Lockie Mola. Patient wants a different birth control med for the cyst  to be called in to [facility] [place] clinic phone number is [phone] because it was too expensive at [facility].    Also wants refill of her diflucan- because she is having the same white vaginal discharge and vaginal itching. Wants it sent [facility] . Explained I will forward to provider and we will get back to her. I will call in the diflucan today.

## 2017-10-11 MED ORDER — NORGESTREL-ETHINYL ESTRADIOL 0.3-30 MG-MCG PO TABS: 1.0000 | ORAL_TABLET | Freq: Every day | ORAL | 5 refills | Status: DC

## 2017-10-11 NOTE — Telephone Encounter (Signed)
Message  Received: 1 week ago  Message Contents  Dove, Wilhemina Cash, MD  [facility]        Lo ovral might be cheaper  She can have 3 months and 5 refills    Patient called front desk- was told will send in new rx today.

## 2017-10-28 ENCOUNTER — Encounter: Payer: Self-pay | Admitting: *Deleted

## 2018-05-04 ENCOUNTER — Ambulatory Visit: Payer: Self-pay | Admitting: Obstetrics & Gynecology

## 2019-01-15 IMAGING — US US SONOHYSTEROGRAM - WITH RAD
1 series · 12 of 24 positions shown · non-contrast
Comparison: Ultrasound from 01/28/2017

CLINICAL DATA: Uterine follow-up.

EXAM:
US SONOHYSTEROGRAM
TECHNIQUE: Following cleansing of the cervix and vagina with Betadine, a
hysterosalpingogram catheter was placed within the endocervical
canal. Sonohysterogram was then performed with transvaginal
sonography during infusion of sterile saline solution into the
endometrial cavity.

[Series 1: us sonohysterogram - with rad · 24 acquisitions, 12 frames shown]
[im 2/24]
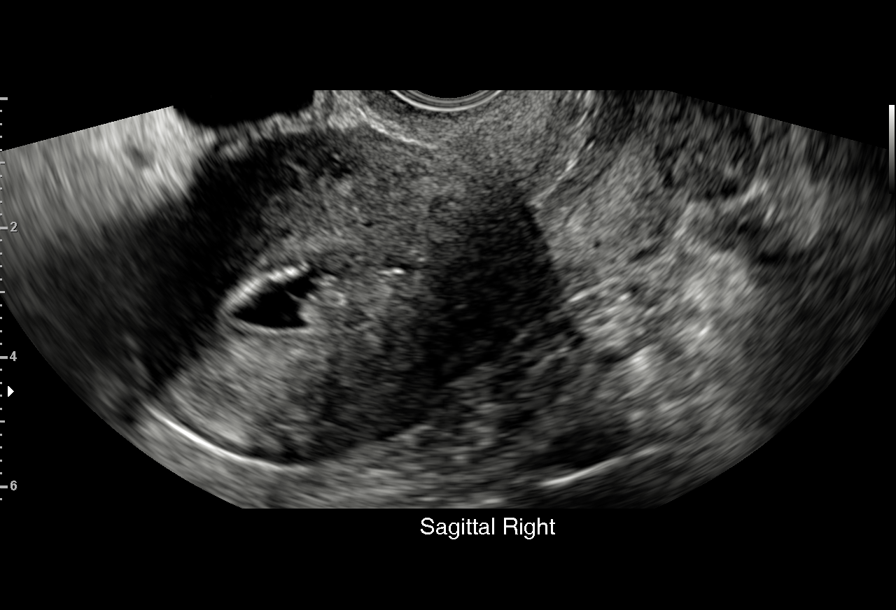
[im 4/24]
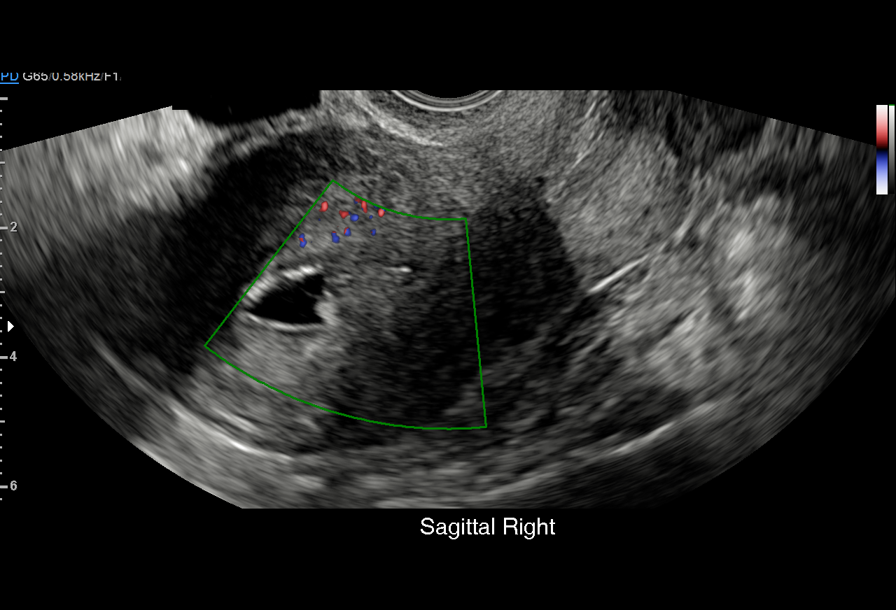
[im 6/24]
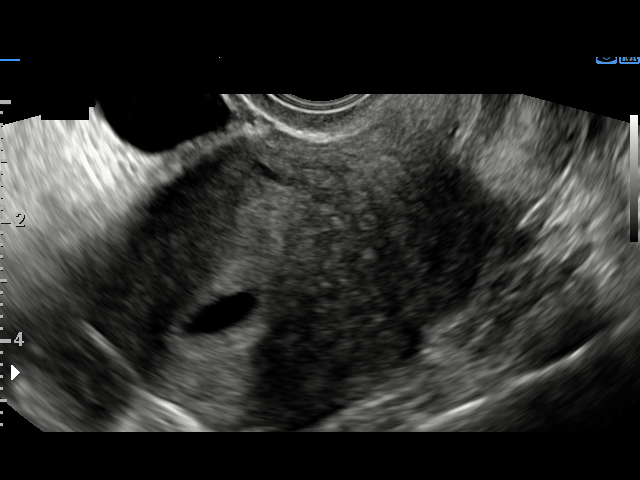
[im 8/24]
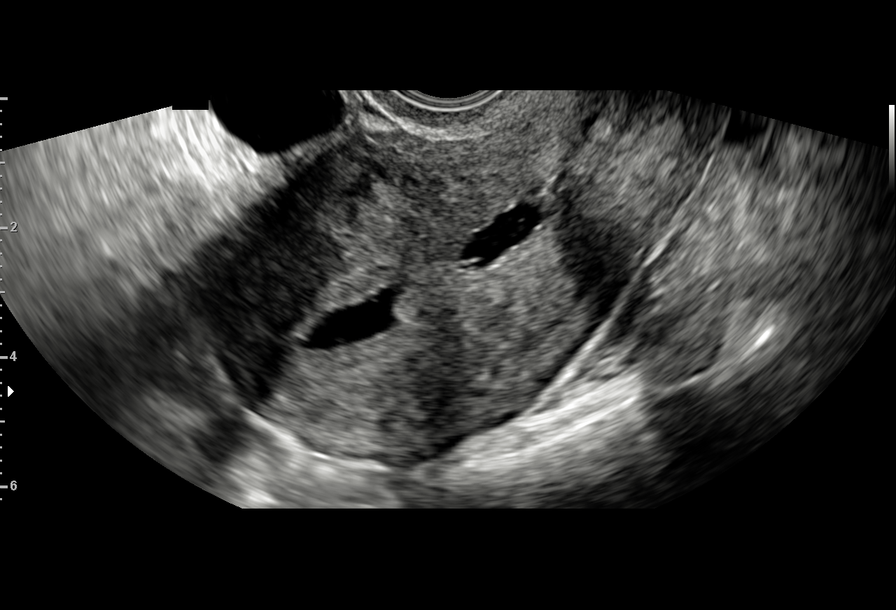
[im 10/24]
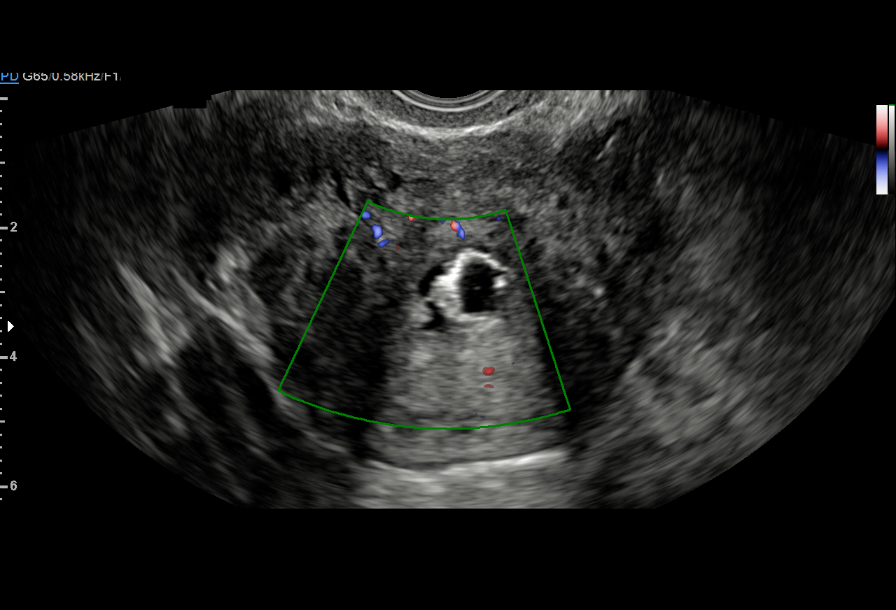
[im 12/24]
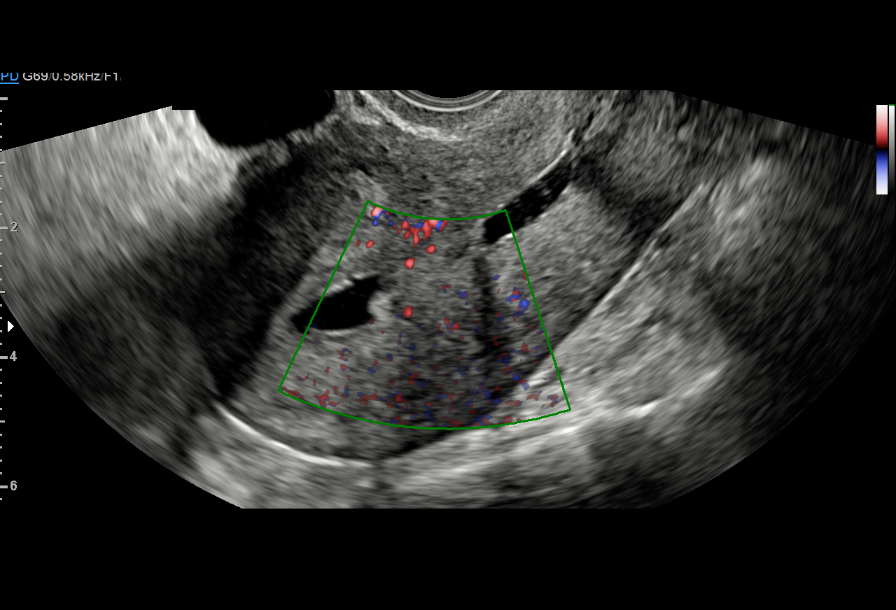
[im 14/24]
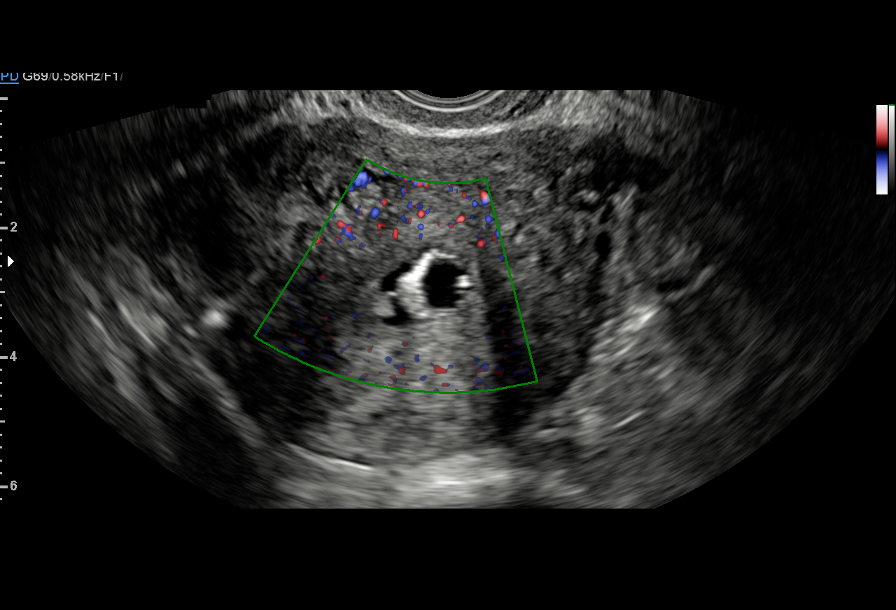
[im 16/24]
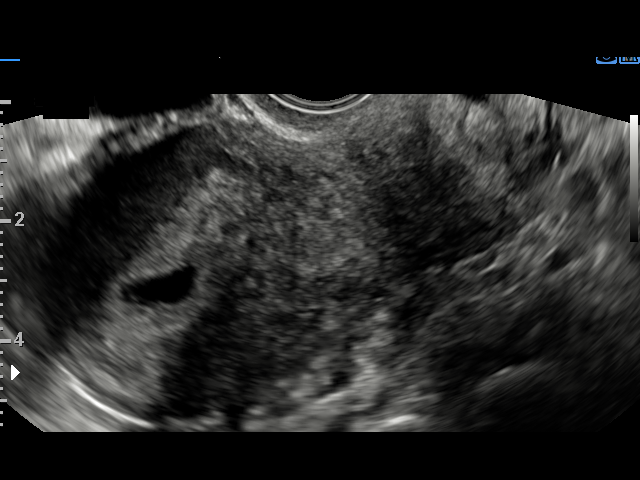
[im 18/24]
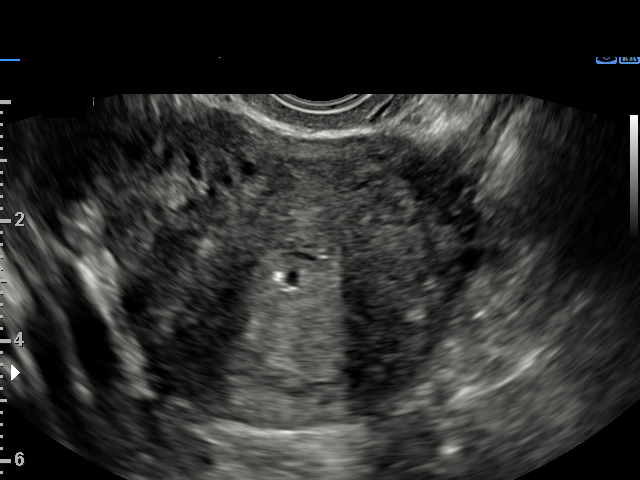
[im 20/24]
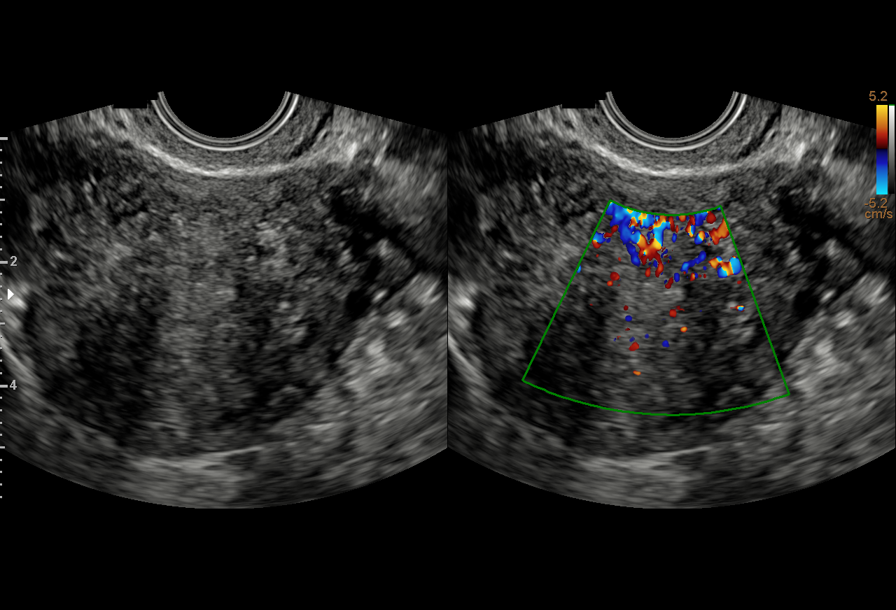
[im 22/24]
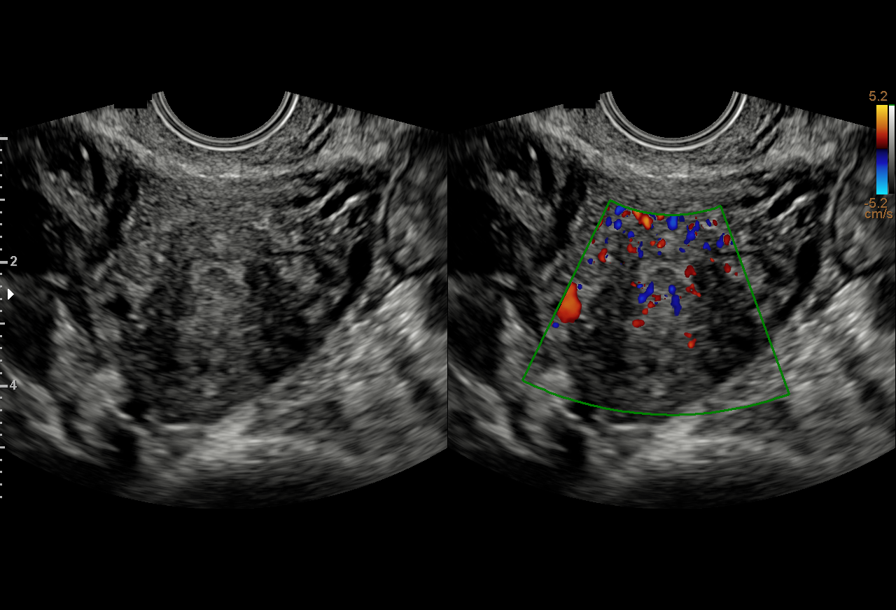
[im 24/24]
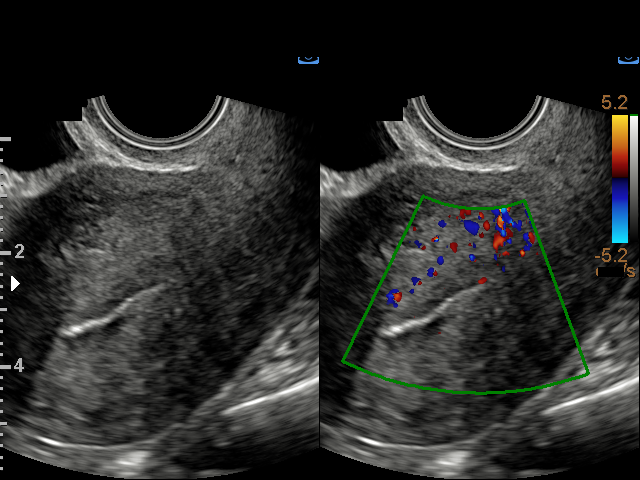

[12 of 24 positions shown; findings below may reference images not displayed]

FINDINGS: There is a focal filling defect within the endometrial cavity
measuring 1.6 x 0.9 cm. The there is mild internal blood flow within
this structure.
IMPRESSION: 1. Examination is positive for a focal filling defect within the
endometrial cavity. Favor uterine follow-up. Correlation with
hysteroscopy and/or biopsy.

## 2020-07-24 ENCOUNTER — Other Ambulatory Visit (HOSPITAL_COMMUNITY)
Admission: RE | Admit: 2020-07-24 | Discharge: 2020-07-24 | Disposition: A | Discharge status: Home / Self Care | Payer: Self-pay | Source: Ambulatory Visit | Attending: Family Medicine | Admitting: Family Medicine

## 2020-07-24 ENCOUNTER — Ambulatory Visit (INDEPENDENT_AMBULATORY_CARE_PROVIDER_SITE_OTHER): Payer: Self-pay | Admitting: Family Medicine

## 2020-07-24 ENCOUNTER — Other Ambulatory Visit: Payer: Self-pay

## 2020-07-24 ENCOUNTER — Encounter: Payer: Self-pay | Admitting: Family Medicine

## 2020-07-24 VITALS — BP 131/87 | HR 51 | Ht 66.0 in | Wt 180.5 lb

## 2020-07-24 DIAGNOSIS — B9689 Other specified bacterial agents as the cause of diseases classified elsewhere: Secondary | ICD-10-CM

## 2020-07-24 DIAGNOSIS — Z3202 Encounter for pregnancy test, result negative: Secondary | ICD-10-CM

## 2020-07-24 DIAGNOSIS — R300 Dysuria: Secondary | ICD-10-CM

## 2020-07-24 DIAGNOSIS — N760 Acute vaginitis: Secondary | ICD-10-CM | POA: Insufficient documentation

## 2020-07-24 DIAGNOSIS — R102 Pelvic and perineal pain: Secondary | ICD-10-CM

## 2020-07-24 DIAGNOSIS — N809 Endometriosis, unspecified: Secondary | ICD-10-CM

## 2020-07-24 DIAGNOSIS — N83201 Unspecified ovarian cyst, right side: Secondary | ICD-10-CM

## 2020-07-24 LAB — POCT URINALYSIS DIP (DEVICE)
Bilirubin Urine: NEGATIVE
Glucose, UA: NEGATIVE mg/dL
Hgb urine dipstick: NEGATIVE
Ketones, ur: NEGATIVE mg/dL
Leukocytes,Ua: NEGATIVE
Nitrite: NEGATIVE
Protein, ur: NEGATIVE mg/dL
Specific Gravity, Urine: >=1.03 (ref 1.005–1.030)
Urobilinogen, UA: 0.2 mg/dL (ref 0.0–1.0)
pH: 6 (ref 5.0–8.0)

## 2020-07-24 LAB — POCT PREGNANCY, URINE: Preg Test, Ur: NEGATIVE

## 2020-07-24 MED ORDER — NITROFURANTOIN MONOHYD MACRO 100 MG PO CAPS: 100.0000 mg | ORAL_CAPSULE | Freq: Two times a day (BID) | ORAL | 0 refills | Status: DC

## 2020-07-24 MED ORDER — NORGESTIMATE-ETH ESTRADIOL 0.25-35 MG-MCG PO TABS: 1.0000 | ORAL_TABLET | Freq: Every day | ORAL | 11 refills | Status: DC

## 2020-07-24 NOTE — Patient Instructions (Signed)
Eleccin del mtodo anticonceptivo Contraception Choices La anticoncepcin, o los mtodos anticonceptivos, hace referencia a los mtodos o dispositivos que evitan el [place]. Mtodos hormonales Implante anticonceptivo Un implante anticonceptivo consiste en un tubo delgado de plstico que contiene una hormona que evita el [place]. Es diferente de un dispositivo intrauterino (DIU). Un mdico lo inserta en la parte superior del brazo. Los implantes pueden ser eficaces durante un mximo de 3 aos. Inyecciones de progestina sola Las inyecciones de progestina sola contienen progestina, una forma sinttica de la hormona progesterona. Un mdico las administra cada 3 meses. Pldoras anticonceptivas Las pldoras anticonceptivas son pastillas que contienen hormonas que evitan el [place]. Deben tomarse una vez al da, preferentemente a [place]. Se necesita una receta para utilizar este mtodo anticonceptivo. Parche anticonceptivo El parche anticonceptivo contiene hormonas que evitan el [place]. Se coloca en la piel, debe cambiarse una vez a la semana durante tres semanas y debe retirarse en la cuarta semana. Se necesita una receta para utilizar este mtodo anticonceptivo. Anillo vaginal Un anillo vaginal contiene hormonas que evitan el embarazo. Se coloca en la vagina durante tres semanas y se retira en la cuarta semana. Luego se repite el proceso con un anillo nuevo. Se necesita una receta para utilizar este mtodo anticonceptivo. Anticonceptivo de emergencia Los anticonceptivos de emergencia son mtodos para evitar un embarazo despus de Best boy sexo sin proteccin. Vienen en forma de pldora y pueden tomarse hasta 5 das despus de [place]. Funcionan mejor cuando se toman lo ms pronto posible luego de [employer]. La mayora de los anticonceptivos de emergencia estn disponibles sin receta mdica. Este mtodo no debe utilizarse como el nico mtodo anticonceptivo.   Mtodos de  barrera Condn masculino Un condn masculino es una vaina delgada que se coloca sobre el pene durante el sexo. Los condones evitan que el esperma ingrese en el cuerpo de la [place]. Pueden utilizarse con un una sustancia que mata a los espermatozoides (espermicida) para aumentar la efectividad. Deben desecharse despus de un uso. Condn femenino Un condn femenino es una vaina blanda y holgada que se coloca en la vagina antes de [place]. El condn evita que el esperma ingrese en el cuerpo de la [place]. Deben desecharse despus de un uso. Diafragma Un diafragma es una barrera blanda con forma de cpula. Se inserta en la vagina antes del sexo, junto con un espermicida. El diafragma bloquea el ingreso de esperma en el tero, y el espermicida mata a los espermatozoides. El Designer, television/film set en la vagina durante 6 a 8 horas despus de Best boy sexo y debe retirarse en el plazo de las 24 horas. Un diafragma es recetado y colocado por un mdico. Debe reemplazarse cada 1 a 2 aos, despus de dar a luz, de aumentar ms de 15lb (6.8kg) y de [place] plvica. Capuchn cervical Un capuchn cervical es una copa redonda y blanda de ltex o plstico que se coloca en el cuello uterino. Se inserta en la vagina antes del sexo, junto con un espermicida. Bloquea el ingreso del esperma en el tero. El capuchn Medical illustrator durante 6 a 8 horas despus de Best boy sexo y debe retirarse en el plazo de las 48 horas. Un capuchn cervical debe ser recetado y colocado por un mdico. Debe reemplazarse cada 2aos. Esponja Una esponja es una pieza blanda y circular de espuma de poliuretano que contiene espermicida. La esponja ayuda a bloquear el ingreso de [employer], y [place]  espermicida mata a los espermatozoides. Marylyn Ishihara, debe humedecerla e insertarla en la vagina. Debe insertarse antes de [employer], debe permanecer dentro al menos durante 6 horas despus de tener sexo y debe retirarse y  Aeronautical engineer en el plazo de las 30 horas. Espermicidas Los espermicidas son sustancias qumicas que matan o bloquean al esperma y no lo dejan ingresar al cuello uterino y al tero. Vienen en forma de crema, gel, supositorio, espuma o comprimido. Un espermicida debe insertarse en la vagina con un aplicador al menos 10 o 15 minutos antes de tener sexo para dar tiempo a que surta [place]. El proceso debe repetirse cada vez que tenga sexo. Los espermicidas no requieren Furniture conservator/restorer.   Anticonceptivos intrauterinos Dispositivo intrauterino (DIU) Un DIU es un dispositivo en forma de T que se coloca en el tero. Existen dos tipos:  DIU hormonal.Este tipo contiene progestina, una forma sinttica de la hormona progesterona. Este tipo puede permanecer colocado durante 3 a 5 aos.  DIU de cobre.Este tipo est recubierto con un alambre de cobre. Puede permanecer colocado durante 10 aos. Mtodos anticonceptivos permanentes Ligadura de trompas en la mujer En este mtodo, se sellan, atan u obstruyen las trompas de Falopio durante una ciruga para Product/process development scientist que el vulo descienda [place]. Esterilizacin histeroscpica En este mtodo, se coloca un implante pequeo y flexible dentro de cada trompa de Falopio. Los implantes hacen que se forme un tejido cicatricial en las trompas de Falopio y que las obstruya para que el espermatozoide no pueda llegar al vulo. El procedimiento demora alrededor de 3 meses para que sea [place]. Debe utilizarse otro mtodo anticonceptivo durante esos 3 meses. Esterilizacin masculina Este es un procedimiento que consiste en atar los conductos que transportan el esperma (vasectoma). Luego del procedimiento, el hombre Equities trader lquido (semen). Debe utilizarse otro mtodo anticonceptivo durante 3 meses despus del procedimiento. Mtodos de planificacin natural Planificacin familiar natural En este mtodo, la pareja no tiene [employer] la mujer podra quedar  Campus. Mtodo calendario En este mtodo, la mujer realiza un seguimiento de la duracin de cada ciclo menstrual, identifica los [employer] que se puede producir un Media planner y no tiene sexo durante esos das. Mtodo de la ovulacin En este mtodo, la pareja evita tener sexo durante la ovulacin. Mtodo sintotrmico Este mtodo implica no tener sexo durante la ovulacin. Normalmente, la mujer comprueba la ovulacin al observar cambios en su temperatura y en la consistencia del moco cervical. Mtodo posovulacin En este mtodo, la pareja espera a que finalice la ovulacin para Adult nurse. Dnde buscar ms informacin  Centers for Disease Control and Prevention (Centros para el Control y Publishing copy de Arboriculturist): http://www.wolf.info/ Resumen  La anticoncepcin, o los mtodos anticonceptivos, hace referencia a los mtodos o dispositivos que evitan el [place].  Los mtodos anticonceptivos hormonales incluyen implantes, inyecciones, pastillas, parches, anillos vaginales y anticonceptivos de Freight forwarder.  Los mtodos anticonceptivos de barrera pueden incluir condones masculinos, condones femeninos, diafragmas, capuchones cervicales, esponjas y espermicidas.  [employer] tipos de DIU (dispositivo intrauterino). Un DIU puede colocarse en el tero de una mujer para evitar el embarazo durante 3 a 5 aos.  La esterilizacin permanente puede realizarse mediante un procedimiento tanto en los hombres como en las mujeres. Los NIKE de Marine scientist natural implican no tener [employer] la mujer podra quedar [place]. Esta informacin no tiene Marine scientist el consejo del mdico. Asegrese de hacerle al mdico cualquier pregunta que  tenga. Document Revised: 12/12/2019 Document Reviewed: 12/12/2019 Elsevier Patient Education  2021 [place] preventivos en las [place] de 21 a 20 aos de edad Preventive Care 29-42 Years Old, Female Los cuidados  preventivos hacen referencia a las opciones en cuanto al estilo de vida y a las visitas al mdico, las cuales pueden promover la salud y Musician. Esto puede comprender lo siguiente:  Un examen fsico anual. Esto tambin se conoce como visita de control de bienestar anual.  Exmenes dentales y oculares de [place] regular.  Vacunas.  Estudios para Health and safety inspector.  Elecciones para un estilo de vida saludable, por ejemplo: ? Seguir una dieta saludable. ? Practicar actividad fsica con regularidad. ? No consumir drogas ni productos que contengan nicotina y tabaco. ? Limitar el consumo de bebidas alcohlicas. Qu puedo esperar para mi visita de cuidado preventivo? Examen fsico El mdico puede controlar lo siguiente:  Cassell Clement y [place]. Estos pueden usarse para calcular el IMC (ndice de masa corporal). El [facility] es una medicin que indica si tiene un peso saludable.  Frecuencia cardaca y presin arterial.  Temperatura corporal.  Piel para detectar manchas anormales. Asesoramiento Su mdico puede preguntarle acerca de:  Problemas mdicos pasados.  Antecedentes mdicos familiares.  Consumo de tabaco, alcohol y drogas.  Su bienestar emocional.  Restaurant manager, fast food y las relaciones personales.  Su actividad sexual.  Hbitos de alimentacin, ejercicio y sueo.  Su trabajo y [place] laboral.  Acceso a armas de fuego.  Mtodos anticonceptivos.  Ciclo menstrual.  Antecedentes de embarazo. Qu vacunas necesito? Las vacunas se aplican a varias edades, segn un calendario. El Viacom recomendar vacunas segn su edad, sus antecedentes mdicos, su estilo de vida y otros factores, como los viajes o el lugar donde trabaja.   Qu pruebas necesito? Anlisis de [employer] de lpidos y colesterol. Estos se pueden verificar cada 5 aos, a partir de los 65 aos de [place].  Anlisis de hepatitisC.  Anlisis de hepatitis B. Pruebas de deteccin  Pruebas  de deteccin de la diabetes. Esto se Set designer un control del azcar en la sangre (glucosa) despus de no haber comido durante un periodo de tiempo (ayuno).  Pruebas de enfermedades de transmisin sexual (ETS), si est en riesgo.  Pruebas de deteccin de cncer relacionado con las mutaciones del BRCA. Es posible que se las deba realizar si tiene antecedentes de cncer de mama, de ovario, de trompas o peritoneal.  Examen plvico y prueba de Papanicolaou. Esto se puede realizar cada 66aos a Renato Gails de los 21aos de edad. A partir de los 30 aos, esto se puede Optometrist cada 5 aos si usted se realiza una prueba de Papanicolaou en combinacin con una prueba de deteccin del virus del papiloma humano (VPH). Hable con su mdico [employer], las opciones de tratamiento y, si corresponde, la necesidad de Optometrist ms pruebas.   Siga estas instrucciones en su casa: Comida y bebida  Siga una dieta saludable que incluya frutas y verduras frescas, cereales integrales, protenas magras y productos lcteos descremados.  Tome los suplementos vitamnicos y [employer] se lo haya indicado el mdico.  No beba alcohol si: ? Su mdico le indica no hacerlo. ? Est embarazada, puede estar embarazada o est tratando de quedar embarazada.  Si bebe alcohol: ? Limite la cantidad que consume de 0 a 1 medida por da. ? Est atenta a la cantidad de alcohol que hay en las bebidas que toma. En  los [place], una medida equivale a una botella de cerveza de 12oz (387m), un vaso de vino de 5oz (1430m o un vaso de una bebida alcohlica de alta graduacin de 1oz (4478m   Estilo de vidNavistar International Corporationlas encas a diario. Cepllese los dientes a la maana y a la noche con pasta dental con fluoruro. Use hilo dental una vez al da.  Mantngase activa. Haga al menos 30 minutos de ejercicio, 5 o ms das cada semana.  No consuma ningn producto que contenga nicotina o  tabaco, como cigarrillos, cigarrillos electrnicos y tabaco de masHigher education careers adviseri necesita ayuda para dejar de fumar, consulte al mdico.  No consuma drogas.  Si es sexualmente activa, practique sexo seguro. Use un condn u otra forma de proteccin para prevenir las ITS (infecciones de transmisin sexual).  Si no desea quedar embarazada, use un mtodo anticonceptivo. Si busca un embarazo, realice una consulta previa al embarazo con el mdico.  Encuentre formas saludables de lidiar con el estrs tales como: ? Meditacin, yoga o escuchar msica. ? Lleve un diario personal. ? Hable con una persona confiable. ? Pase tiempo con amigos y familiares. Seguridad  Usa[place]empre el cinturn de seguridad al conducir o viajar en un vehculo.  No conduzca: ? Si ha estado bebiendo alcohol. No viaje con un conductor que ha estado bebiendo. ? Si est cansada o distrada. ? Mientras est enviando mensajes de texto.  Use un casco y otros equipos de proteccin durTroyportivas.  Si tiene armas de fuego en su casa, asegrese de seguir todos los procedimientos de seguridad correspondientes.  Busque ayuda si fue vctima de abuso fsico o abuso sexual. Cundo volver?  Acuda al mdico una vez al ao para una visita anual de control de bienestar.  Pregntele al mdico con qu frecuencia debe realizarse un control de la vista y los dientes.  Mantenga su esquema de vacunacin al da. Esta informacin no tiene comMarine scientist consejo del mdico. Asegrese de hacerle al mdico cualquier pregunta que tenga. Document Revised: 03/07/2020 Document Reviewed: 04/18/2019 Elsevier Patient Education  2021 [employer]

## 2020-07-24 NOTE — Progress Notes (Signed)
GYNECOLOGY OFFICE VISIT NOTE  History:   Catherine Ingram is a 45 y.o. [phone] here today for follow up of a cyst.  Chart review shows patient last seen in 2019 for pelvic pain At that time had ex-laparoscopy with Dr. Hulan Fray which had clinical signs of endometriosis, however biopsy was negative Review of care everywhere shows hospital encounter in 04/28/2019 in which she underwent a CT A/P which showed a dominant R follicle in the R ovary measuring 4.6 cm No other imaging since that time  Today patient reports she was told she had a cyst in her R ovary previously but has not followed up Reports she did not have pain around that time but started to have pain around 2 months ago Pain is RLQ, comes and goes Pain improves with tylenol or advil Not taking any form of birth control Reports she is sexually active, uses condoms but not consistently Patient's last menstrual period was 07/06/2020 (exact date). Had unprotected sex last week on 07/18/2020 Has a regular period  Also having urinary frequency for the past two weeks Occasionally has burning with urination Has had UTI in the past and this feels similar to those episodes Has had kidney stones in the past as well   Past Medical History:  Diagnosis Date  . Anemia   . Depression   . Headache   . Hypertension   . Kidney stones     Past Surgical History:  Procedure Laterality Date  . APPENDECTOMY     2015  . DILATATION & CURETTAGE/HYSTEROSCOPY WITH MYOSURE N/A 08/12/2017   Procedure: DILATATION & CURETTAGE/HYSTEROSCOPY WITH MYOSURE;  Surgeon: Emily Filbert, MD;  Location: [facility];  Service: Gynecology;  Laterality: N/A;  . LAPAROSCOPIC LYSIS OF ADHESIONS N/A 08/12/2017   Procedure: LAPAROSCOPIC LYSIS OF ADHESIONS;  Surgeon: Emily Filbert, MD;  Location: [facility];  Service: Gynecology;  Laterality: N/A;  . LAPAROSCOPY N/A 08/12/2017   Procedure: LAPAROSCOPY DIAGNOSTIC WITH PERITONEAL BIOPSIES;  Surgeon: Emily Filbert, MD;   Location: [facility];  Service: Gynecology;  Laterality: N/A;  . MYOMECTOMY N/A 06/04/2015   Procedure: MYOMECTOMY;  Surgeon: Emily Filbert, MD;  Location: [facility];  Service: Gynecology;  Laterality: N/A;  . OVARIAN CYST REMOVAL    . WISDOM TOOTH EXTRACTION  01/2013    The following portions of the patient's history were reviewed and updated as appropriate: allergies, current medications, past family history, past medical history, past social history, past surgical history and problem list.   Health Maintenance:  Normal pap and negative HRHPV: 09/17/2017.  Normal mammogram: 06/03/2017.   Review of Systems:  Pertinent items noted in HPI and remainder of comprehensive ROS otherwise negative.  Physical Exam:  BP 131/87   Pulse (!) 51   Ht 5\' 6"  (1.676 m)   Wt 180 lb 8 oz (81.9 kg)   LMP 07/06/2020 (Exact Date)   BMI 29.13 kg/m  CONSTITUTIONAL: Well-developed, well-nourished female in no acute distress.  HEENT:  Normocephalic, atraumatic. External right and left ear normal. No scleral icterus.  NECK: Normal range of motion, supple, no masses noted on observation SKIN: No rash noted. Not diaphoretic. No erythema. No pallor. MUSCULOSKELETAL: Normal range of motion. No edema noted. NEUROLOGIC: Alert and oriented to person, place, and time. Normal muscle tone coordination.  PSYCHIATRIC: Normal mood and affect. Normal behavior. Normal judgment and thought content. RESPIRATORY: Effort normal, no problems with respiration noted ABDOMEN: No masses noted. No other overt distention noted.   PELVIC: Normal appearing  external genitalia; normal appearing vaginal mucosa and cervix.  Small amount of brown discharge noted.  Normal uterine size, no other palpable masses, no uterine or adnexal tenderness.  Labs and Imaging Results for orders placed or performed in visit on 07/24/20 (from the past 168 hour(s))  Pregnancy, urine POC   Collection Time: 07/24/20 10:36 AM  Result Value Ref Range   Preg Test, Ur  NEGATIVE NEGATIVE   No results found.    Assessment and Plan:   Problem List Items Addressed This Visit      Other   Female pelvic pain - Primary   Relevant Medications   norgestimate-ethinyl estradiol (ORTHO-CYCLEN) 0.25-35 MG-MCG tablet   Other Relevant Orders   US Transvaginal Non-OB   Endometriosis    Other Visit Diagnoses    Bacterial vaginosis       Relevant Medications   nitrofurantoin, macrocrystal-monohydrate, (MACROBID) 100 MG capsule   Other Relevant Orders   Cervicovaginal ancillary only( [place])   Dysuria       Relevant Medications   nitrofurantoin, macrocrystal-monohydrate, (MACROBID) 100 MG capsule   Other Relevant Orders   Urine Culture   POCT Urinalysis Dipstick   Cyst of right ovary          #Cyst of R ovary #RLQ pain Discussed this is likely physiologic as she still has regular menses. Exam benign without any masses palpated. May also be secondary to endometriosis though diagnosis is questionable given negative pathology from prior surgery. UPT today was negative, regardless given timing of unprotected intercourse recommended she take another home UPT if she does not get her menses as expected. Desires imaging, given financial aid form and ordered for TVUS to evaluate. Will also trial OCP's to see if this improves pain, return in 3 months for re-evaluation.   #Vaginal discharge Swab collected  #Dysuria Symptoms c/w prior UTI though UA very unremarkable, UCx collected and empiric rx for Macrobid sent.   Please refer to After Visit Summary for other counseling recommendations.   Return in 3 months (on 10/24/2020) for follow up pelvic pain.    Total face-to-face time with patient: 25 minutes.  Over 50% of encounter was spent on counseling and coordination of care.   Clarnce Flock, MD/[facility] [employer], [place]

## 2020-07-25 ENCOUNTER — Telehealth: Payer: Self-pay | Admitting: Family Medicine

## 2020-07-25 LAB — URINE CULTURE

## 2020-07-25 LAB — CERVICOVAGINAL ANCILLARY ONLY
Bacterial Vaginitis (gardnerella): NEGATIVE
Candida Glabrata: POSITIVE — AB
Candida Vaginitis: POSITIVE — AB
Chlamydia: NEGATIVE
Comment: NEGATIVE
Comment: NEGATIVE
Comment: NEGATIVE
Comment: NEGATIVE
Comment: NEGATIVE
Comment: NORMAL
Neisseria Gonorrhea: NEGATIVE
Trichomonas: NEGATIVE

## 2020-07-25 MED ORDER — FLUCONAZOLE 150 MG PO TABS: 150.0000 mg | ORAL_TABLET | Freq: Once | ORAL | 0 refills | Status: AC

## 2020-07-25 NOTE — Telephone Encounter (Signed)
Patient without mychart.  Please call and let her know swab was positive for yeast, rx sent to pharmacy.

## 2020-07-29 NOTE — Telephone Encounter (Signed)
Called patient with assistance of [address] [place], [place] # [id number].   Patient informed that her swab showed she has yeast infection. Informed patient that a prescription was sent to her Pharmacy. She reports she picked it up yesterday.   Patient to call if not improving.

## 2020-11-05 ENCOUNTER — Telehealth: Payer: Self-pay | Admitting: Family Medicine

## 2020-11-07 MED ORDER — FLUCONAZOLE 150 MG PO TABS: 150.0000 mg | ORAL_TABLET | Freq: Once | ORAL | 0 refills | Status: AC

## 2020-11-07 NOTE — Telephone Encounter (Signed)
Pt called with Spanish Interpreter Eda R. Pt reports that she is having white discharge with itchiness.  Pt states that she had this same sx's three months ago.  I advised the patient that I can e-prescribe Diflucan per standing protocol and if she continues to have the same sx's that she will need to please call the office to schedule a nurse visit for a self swab.  Pt verbalized understanding.    Frances Nickels  11/07/20

## 2021-04-15 ENCOUNTER — Other Ambulatory Visit: Payer: Self-pay

## 2021-04-15 ENCOUNTER — Ambulatory Visit (INDEPENDENT_AMBULATORY_CARE_PROVIDER_SITE_OTHER): Payer: Self-pay

## 2021-04-15 ENCOUNTER — Other Ambulatory Visit (HOSPITAL_COMMUNITY)
Admission: RE | Admit: 2021-04-15 | Discharge: 2021-04-15 | Disposition: A | Discharge status: Home / Self Care | Payer: Self-pay | Source: Ambulatory Visit | Attending: Family Medicine | Admitting: Family Medicine

## 2021-04-15 ENCOUNTER — Telehealth: Payer: Self-pay | Admitting: Family Medicine

## 2021-04-15 VITALS — BP 175/90 | HR 55

## 2021-04-15 DIAGNOSIS — R300 Dysuria: Secondary | ICD-10-CM

## 2021-04-15 DIAGNOSIS — N898 Other specified noninflammatory disorders of vagina: Secondary | ICD-10-CM | POA: Insufficient documentation

## 2021-04-15 LAB — POCT URINALYSIS DIP (DEVICE)
Bilirubin Urine: NEGATIVE
Glucose, UA: NEGATIVE mg/dL
Hgb urine dipstick: NEGATIVE
Ketones, ur: NEGATIVE mg/dL
Leukocytes,Ua: NEGATIVE
Nitrite: NEGATIVE
Protein, ur: NEGATIVE mg/dL
Specific Gravity, Urine: >=1.03 (ref 1.005–1.030)
Urobilinogen, UA: 1 mg/dL (ref 0.0–1.0)
pH: 6 (ref 5.0–8.0)

## 2021-04-15 MED ORDER — FLUCONAZOLE 150 MG PO TABS
150.0000 mg | ORAL_TABLET | Freq: Once | ORAL | 0 refills | Status: AC
Start: 2021-04-15 — End: 2021-04-15

## 2021-04-15 NOTE — Progress Notes (Signed)
Here today with complaint of vaginal itching, burning with urination, and increased urinary frequency. UA is normal. Urine sent for culture due to symptoms of UTI. Will contact patient with any abnormal results. Reports white vaginal discharge, denies any odor. Pt is very concerned for presence of STI. Self swab instructions given and specimen obtained. Pt reports swollen, irritated area to posterior side of vaginal opening. Beard, DO to bedside for brief pelvic exam; exam is normal, no concerning areas visualized. Pt instructed to follow up with any changes. Verbal order for Diflucan 150 mg repeat in 72 hours if needed given by provider.   Apolonio Schneiders RN 04/16/21

## 2021-04-15 NOTE — Telephone Encounter (Signed)
Patient state she is having vaginal pain and burning she would like for someone to call her back

## 2021-04-15 NOTE — Telephone Encounter (Signed)
Call placed to pt. Spoke with pt with interpreter Eda. Pt states having urinary burning and vaginal irritation for 2 days. Pt has tried OTC monistat for possible yeast infection but did not resolve symptoms.  Pt advised to come to office today at 3:30 pm for nurse visit. Pt agreeable to plan of care.   Catherine Ingram

## 2021-04-16 ENCOUNTER — Other Ambulatory Visit: Payer: Self-pay | Admitting: Obstetrics & Gynecology

## 2021-04-16 DIAGNOSIS — N760 Acute vaginitis: Secondary | ICD-10-CM

## 2021-04-16 LAB — CERVICOVAGINAL ANCILLARY ONLY
Bacterial Vaginitis (gardnerella): NEGATIVE
Candida Glabrata: POSITIVE — AB
Candida Vaginitis: POSITIVE — AB
Chlamydia: NEGATIVE
Comment: NEGATIVE
Comment: NEGATIVE
Comment: NEGATIVE
Comment: NEGATIVE
Comment: NEGATIVE
Comment: NORMAL
Neisseria Gonorrhea: NEGATIVE
Trichomonas: NEGATIVE

## 2021-04-16 MED ORDER — FLUCONAZOLE 150 MG PO TABS: 150.0000 mg | ORAL_TABLET | Freq: Once | ORAL | 0 refills | Status: AC

## 2021-04-16 NOTE — Progress Notes (Signed)
Call from access clinic- by accident pt threw out the medication and requesting refill of Diflucan.  Rx sent in.

## 2021-04-16 NOTE — Progress Notes (Addendum)
Patient was evaluated by nursing staff. Agree with assessment and plan.   Additionally evaluated by myself due to concern for irritated area at her vaginal opening and reassuring as below. Pelvic exam performed with chaperone.   Pelvic exam: VULVA: normal appearing vulva with no masses, tenderness or lesions, VAGINA: normal appearing vagina at the most external region with normal color, no lesions. + White vaginal discharge.    BP also elevated during evaluation. Patient reports she is already on 3 anti-hypertensives and taking as prescribed. She has an appt with her PCP in 05/2021. Instructed to call their office to be seen sooner for poorly controlled HTN. Instructed to check BP at home and keep journal. Currently no symptoms, if any SOB, CP, severe HA, lightheadedness/dizziness to go to the ED.

## 2021-04-19 LAB — URINE CULTURE

## 2021-04-21 ENCOUNTER — Telehealth: Payer: Self-pay

## 2021-04-21 NOTE — Telephone Encounter (Signed)
Call placed to pt with interpreter Raquel. Spoke with pt. Pt given results and recommendations per Dr Higinio Plan. Pt verbalized understanding and agreeable to plan of care. Pr denies any symptoms at this time. Pt has taken Rx Diflucan on 11/22. Colletta Maryland, RN

## 2021-04-21 NOTE — Telephone Encounter (Signed)
-----   Message from Patriciaann Clan, DO sent at 04/20/2021 11:43 PM EST ----- Swab positive for yeast. Urine culture without significant bacteria growth, no UTI.   Already sent in diflucan when seen on 11/22 which should treat this.   Please let patient know results above and reassure her that the STD screen was normal otherwise. She can always follow up/call if continues to have symptoms despite treatment.   Thank you, Dr Higinio Plan

## 2021-05-27 ENCOUNTER — Telehealth: Payer: Self-pay | Admitting: Lactation Services

## 2021-05-27 ENCOUNTER — Telehealth: Payer: Self-pay | Admitting: Family Medicine

## 2021-05-27 MED ORDER — NORGESTIMATE-ETH ESTRADIOL 0.25-35 MG-MCG PO TABS: 1.0000 | ORAL_TABLET | Freq: Every day | ORAL | 2 refills | Status: DC

## 2021-05-27 NOTE — Telephone Encounter (Signed)
Patient called the front desk and reported to [employer], [place] Interpreter that she has a vaginal infection and requested treatment. Per chart reviewed patient was treated with 2 doses Diflucan in November.   Called patient x 3 to assess symptoms and she did not answer. She has nurse visit appointment scheduled for next week for vaginal swab.

## 2021-05-27 NOTE — Telephone Encounter (Signed)
Patient called in with the help of Raquel stating she is on her last pack of birth control and wants to know if her next refill can be sent in to the pharmacy so she does not run out.

## 2021-05-27 NOTE — Telephone Encounter (Signed)
Returned patients call with assistance of [place], Administrator, sports. Patient did not answer. LM for her to call the office to make a follow up appointment and that refill for 3 months will be sent to Pharmacy today.

## 2021-06-02 ENCOUNTER — Ambulatory Visit (INDEPENDENT_AMBULATORY_CARE_PROVIDER_SITE_OTHER): Payer: Self-pay | Admitting: *Deleted

## 2021-06-02 ENCOUNTER — Other Ambulatory Visit (HOSPITAL_COMMUNITY)
Admission: RE | Admit: 2021-06-02 | Discharge: 2021-06-02 | Disposition: A | Discharge status: Home / Self Care | Payer: Self-pay | Source: Ambulatory Visit | Attending: Family Medicine | Admitting: Family Medicine

## 2021-06-02 ENCOUNTER — Other Ambulatory Visit: Payer: Self-pay

## 2021-06-02 VITALS — BP 149/100 | HR 58 | Ht 67.0 in | Wt 187.9 lb

## 2021-06-02 DIAGNOSIS — N898 Other specified noninflammatory disorders of vagina: Secondary | ICD-10-CM

## 2021-06-02 NOTE — Progress Notes (Signed)
Here for self swab. C/o whitish vaginal discharge and itching. Requests full self swab. Explained will call her if swab positive and she needs treatment. She states with last yeast infection in 03/2021 feels like didn't get better after treatment. Informed pt if + yeast again, and we send in one diflucan and it doesn't get better after one week and we should be able to send in a second diflucan. She voices understanding.  Today BP 145/101, states she just took her bp meds before coming to appt today, a bout 0830. Repeat bp 149/100. Goes to community clinic in [place] for bp- seen there last about 6 months ago.Advised to call that office and request appointment for bp check and tell them her bp was high in our office. Also insructed her if she has severe headache or visual issues to go to ER because bp could be dangerously high. She voices understanding.  Allon Costlow,RN

## 2021-06-02 NOTE — Progress Notes (Signed)
Attestation of Attending Supervision of clinical support staff: I agree with the care provided to this patient and was available for any consultation.  I have reviewed the RN's note and chart. I was available for consult and to see the patient if needed.   Mills Mitton MD MPH Attending Physician Faculty Practice- [facility]  

## 2021-06-03 LAB — CERVICOVAGINAL ANCILLARY ONLY
Bacterial Vaginitis (gardnerella): NEGATIVE
Candida Glabrata: NEGATIVE
Candida Vaginitis: NEGATIVE
Chlamydia: NEGATIVE
Comment: NEGATIVE
Comment: NEGATIVE
Comment: NEGATIVE
Comment: NEGATIVE
Comment: NEGATIVE
Comment: NORMAL
Neisseria Gonorrhea: NEGATIVE
Trichomonas: NEGATIVE

## 2021-07-07 ENCOUNTER — Other Ambulatory Visit: Payer: Self-pay

## 2021-07-07 ENCOUNTER — Ambulatory Visit (INDEPENDENT_AMBULATORY_CARE_PROVIDER_SITE_OTHER): Payer: Self-pay | Admitting: Family Medicine

## 2021-07-07 ENCOUNTER — Other Ambulatory Visit (HOSPITAL_COMMUNITY)
Admission: RE | Admit: 2021-07-07 | Discharge: 2021-07-07 | Disposition: A | Discharge status: Home / Self Care | Payer: Self-pay | Source: Ambulatory Visit | Attending: Advanced Practice Midwife | Admitting: Advanced Practice Midwife

## 2021-07-07 ENCOUNTER — Encounter: Payer: Self-pay | Admitting: Family Medicine

## 2021-07-07 VITALS — BP 132/87 | HR 62 | Wt 185.0 lb

## 2021-07-07 DIAGNOSIS — Z86018 Personal history of other benign neoplasm: Secondary | ICD-10-CM

## 2021-07-07 DIAGNOSIS — N898 Other specified noninflammatory disorders of vagina: Secondary | ICD-10-CM | POA: Insufficient documentation

## 2021-07-07 DIAGNOSIS — Z9889 Other specified postprocedural states: Secondary | ICD-10-CM

## 2021-07-07 DIAGNOSIS — R102 Pelvic and perineal pain: Secondary | ICD-10-CM

## 2021-07-07 DIAGNOSIS — Z1231 Encounter for screening mammogram for malignant neoplasm of breast: Secondary | ICD-10-CM

## 2021-07-07 DIAGNOSIS — N83201 Unspecified ovarian cyst, right side: Secondary | ICD-10-CM

## 2021-07-07 DIAGNOSIS — Z30011 Encounter for initial prescription of contraceptive pills: Secondary | ICD-10-CM

## 2021-07-07 MED ORDER — FLUCONAZOLE 150 MG PO TABS: 150.0000 mg | ORAL_TABLET | Freq: Once | ORAL | 3 refills | Status: AC

## 2021-07-07 MED ORDER — NORETHINDRONE 0.35 MG PO TABS: 1.0000 | ORAL_TABLET | Freq: Every day | ORAL | 11 refills | Status: DC

## 2021-07-07 NOTE — Progress Notes (Signed)
Follow up for vaginal irritation   White vaginal discharge Vaginal itching  Denies odor

## 2021-07-07 NOTE — Patient Instructions (Signed)
Remedios naturales para la vaginosis bacteriana  Opcin #1 1 cucharada de aceite de coco fractiionado 10 gotas de aceite de Melaleuca (rbol de t)  Mezclar bien los ingredientes.  Remoje 3-4 tampones (en aplicadores) en esa mezcla hasta que toda o casi toda la mezcla est absorbida en los tampones.  Inserte 1 tampn saturado por va vaginal y selo durante la noche durante 3-4 noches.    Opcin #2 (a veces para ser usada junto con la opcin #1) Llene la baera con suficiente para cubrir el agua tibia del regazo / abdomen inferior.  Mezcle 1/2 taza de bicarbonato de sodio en agua.  Remoje en agua / mezcla de bicarbonato de sodio durante al menos 20 minutos.  Asegrese de hacer buches con agua entre las piernas para obtener la mayor cantidad posible en la vagina.  Este remojo debe hacerse despus de las relaciones sexuales y los Agricultural engineer.    Opcin #3 (a veces para ser usada junto con la opcin #1 y 2) Llene la baera con suficiente para cubrir el agua tibia del regazo / abdomen inferior.  Mezcle 2-4 tazas de vinagre de sidra de [employer]  Remoje en la mezcla de agua / vinagre durante al menos 20 minutos.  Asegrese de hacer buches con agua entre las piernas para obtener la mayor cantidad posible en la vagina.  Este remojo debe hacerse despus de las relaciones sexuales y los Agricultural engineer.

## 2021-07-07 NOTE — Progress Notes (Signed)
° °  GYNECOLOGY PROBLEM  VISIT ENCOUNTER NOTE  Subjective:   Catherine Ingram is a 46 y.o. [id number] female here for a problem GYN visit.  Current complaints: white discharge with itching for 2 months. Tried monostat which did not help. Reports no odor. + vag irritation. Reports she needs rx for OCP sent. She is on two medication for blood pressure and it has been elevating. She does not smoke.   Denies abnormal vaginal bleeding, pelvic pain, problems with intercourse or other gynecologic concerns.    Gynecologic History No LMP recorded.  Contraception: OCP (estrogen/progesterone)  Health Maintenance Due  Topic Date Due   COVID-19 Vaccine (1) Never done   TETANUS/TDAP  Never done   INFLUENZA VACCINE  12/23/2020    The following portions of the patient's history were reviewed and updated as appropriate: allergies, current medications, past family history, past medical history, past social history, past surgical history and problem list.  Review of Systems Pertinent items are noted in HPI.   Objective:  BP 132/87    Pulse 62    Wt 185 lb (83.9 kg)    BMI 28.98 kg/m  Gen: well appearing, NAD HEENT: no scleral icterus CV: RR Lung: Normal WOB Ext: warm well perfused  PELVIC: self collected today   Assessment and Plan:  1. Vaginal discharge Will treat for candida with diflucan, monostat did not help - Cervicovaginal ancillary only( [facility]) - fluconazole (DIFLUCAN) 150 MG tablet; Take 1 tablet (150 mg total) by mouth once for 1 dose. Can take additional dose three days later if symptoms persist  Dispense: 1 tablet; Refill: 3  2. Female pelvic pain - Prior dermoid - US PELVIC COMPLETE WITH TRANSVAGINAL; Future  3. Encounter for initial prescription of contraceptive pills Discussed changing to progesterone only given her BP, agrees and desire change.  - norethindrone (MICRONOR) 0.35 MG tablet; Take 1 tablet (0.35 mg total) by mouth daily.  Dispense: 28 tablet;  Refill: 11  4. H/O excision of dermoid cyst  5. Health Maintenance - pap UTD - Needs mammogram    Please refer to After Visit Summary for other counseling recommendations.   Return if symptoms worsen or fail to improve.  Caren Macadam, MD, MPH, ABFM Attending [place] for [facility]

## 2021-07-08 LAB — CERVICOVAGINAL ANCILLARY ONLY
Bacterial Vaginitis (gardnerella): NEGATIVE
Candida Glabrata: NEGATIVE
Candida Vaginitis: POSITIVE — AB
Chlamydia: NEGATIVE
Comment: NEGATIVE
Comment: NEGATIVE
Comment: NEGATIVE
Comment: NEGATIVE
Comment: NEGATIVE
Comment: NORMAL
Neisseria Gonorrhea: NEGATIVE
Trichomonas: NEGATIVE

## 2021-07-09 ENCOUNTER — Telehealth: Payer: Self-pay

## 2021-07-09 NOTE — Telephone Encounter (Addendum)
-----   Message from Caren Macadam, MD sent at 07/08/2021  5:06 PM EST ----- Candida present. Diflucan sent in during appointment 2/13.  Please call and see if sx are improving.   Called pt with Spanish Interpreter Eda R., and left message that I am calling to see if she is feeling better.  If not to please give the office a call back.    Frances Nickels  07/09/21

## 2021-07-14 ENCOUNTER — Ambulatory Visit (HOSPITAL_COMMUNITY)
Admission: RE | Admit: 2021-07-14 | Discharge: 2021-07-14 | Disposition: A | Discharge status: Home / Self Care | Payer: Self-pay | Source: Ambulatory Visit | Attending: Family Medicine | Admitting: Family Medicine

## 2021-07-14 ENCOUNTER — Other Ambulatory Visit: Payer: Self-pay

## 2021-07-14 DIAGNOSIS — R102 Pelvic and perineal pain: Secondary | ICD-10-CM | POA: Insufficient documentation

## 2021-07-15 ENCOUNTER — Encounter: Payer: Self-pay | Admitting: Family Medicine

## 2021-07-15 DIAGNOSIS — N83201 Unspecified ovarian cyst, right side: Secondary | ICD-10-CM | POA: Insufficient documentation

## 2021-07-15 NOTE — Addendum Note (Signed)
Addended by: Caryl Bis on: 07/15/2021 03:01 PM   Modules accepted: Orders

## 2021-08-12 ENCOUNTER — Encounter: Payer: Self-pay | Admitting: *Deleted

## 2021-08-12 ENCOUNTER — Other Ambulatory Visit: Payer: Self-pay

## 2021-08-12 ENCOUNTER — Telehealth: Payer: Self-pay | Admitting: *Deleted

## 2021-08-12 ENCOUNTER — Other Ambulatory Visit: Payer: Self-pay | Admitting: *Deleted

## 2021-08-12 ENCOUNTER — Ambulatory Visit: Payer: Self-pay | Admitting: *Deleted

## 2021-08-12 ENCOUNTER — Ambulatory Visit
Admission: RE | Admit: 2021-08-12 | Discharge: 2021-08-12 | Disposition: A | Discharge status: Home / Self Care | Payer: No Typology Code available for payment source | Source: Ambulatory Visit | Attending: Obstetrics and Gynecology | Admitting: Obstetrics and Gynecology

## 2021-08-12 VITALS — BP 120/78 | Wt 190.0 lb

## 2021-08-12 DIAGNOSIS — Z1239 Encounter for other screening for malignant neoplasm of breast: Secondary | ICD-10-CM

## 2021-08-12 DIAGNOSIS — Z1231 Encounter for screening mammogram for malignant neoplasm of breast: Secondary | ICD-10-CM

## 2021-08-12 DIAGNOSIS — N898 Other specified noninflammatory disorders of vagina: Secondary | ICD-10-CM

## 2021-08-12 MED ORDER — FLUCONAZOLE 150 MG PO TABS: 150.0000 mg | ORAL_TABLET | Freq: Once | ORAL | 0 refills | Status: AC

## 2021-08-12 NOTE — Progress Notes (Signed)
Ms. Darleen Moffitt is a 46 y.o. female who presents to [facility] clinic today with no complaints.  ?  ?Pap Smear: Pap smear not completed today. Last Pap smear was 09/17/2017 at [facility] [place] [facility] and was normal with negative HPV. Per patient has history of an abnormal Pap smear in 2008 or 2009 that a repeat Pap smear was completed for follow up that was normal. Per patient all Pap smears have been normal since repeat Pap smear and has had at least three normal Pap smears. Last Pap smear result is available in Epic. ?  ?Physical exam: ?Breasts ?Breasts symmetrical. No skin abnormalities bilateral breasts. No nipple retraction bilateral breasts. No nipple discharge bilateral breasts. No lymphadenopathy. No lumps palpated bilateral breasts. No complaints of pain or tenderness on exam. ? ?MS DIGITAL SCREENING BILATERAL ? ?Result Date: 06/03/2017 ?CLINICAL DATA:  Screening. EXAM: DIGITAL SCREENING BILATERAL MAMMOGRAM WITH CAD COMPARISON:  None. ACR Breast Density Category c: The breast tissue is heterogeneously dense, which may obscure small masses FINDINGS: There are no findings suspicious for malignancy. Images were processed with CAD. IMPRESSION: No mammographic evidence of malignancy. A result letter of this screening mammogram will be mailed directly to the patient. RECOMMENDATION: Screening mammogram in one year. (Code:SM-B-01Y) BI-RADS CATEGORY  1: Negative. Electronically Signed   By: Claudie Revering M.D.   On: 06/03/2017 17:07   ?      ?Pelvic/Bimanual ?Pap is not indicated today per BCCCP guidelines. ?  ?Smoking History: ?Patient is a former smoker that quit 06/25/1997. ?  ?Patient Navigation: ?Patient education provided. Access to services provided for patient through [place] program. Spanish interpreter Rudene Anda from [facility] provided.  ?  ?Breast and Cervical Cancer Risk Assessment: ?Patient has family history of two maternal aunts having breast cancer. Patient has no known  genetic mutations or history of radiation treatment to the chest before age 59. Patient does not have history of cervical dysplasia, immunocompromised, or DES exposure in-utero. ? ?Risk Assessment   ? ? Risk Scores   ? ?   08/12/2021  ? Last edited by: Demetrius Revel, LPN  ? 5-year risk: 0.5 %  ? Lifetime risk: 6.2 %  ? ?  ?  ? ?  ? ? ?A: ?BCCCP exam without pap smear ?No complaints. ? ?P: ?Referred patient to the [place] for a screening mammogram on mobile unit. Appointment scheduled Tuesday, August 12, 2021 at 1500. ? ?Loletta Parish, RN ?08/12/2021 2:24 PM   ?

## 2021-08-12 NOTE — Patient Instructions (Signed)
Explained breast self awareness with [name]. Patient did not need a Pap smear today due to last Pap smear and HPV typing was 09/17/2017. Let her know BCCCP will cover Pap smears and HPV typing every 5 years unless has a history of abnormal Pap smears. Referred patient to the [facility] for a screening mammogram on mobile unit. Appointment scheduled Tuesday, August 12, 2021 at 1500. Patient aware of appointment and will be there. Let patient know the [facility] will follow up with her within the next couple weeks with results of her mammogram by letter or phone. [name] verbalized understanding. ? ?Catherine Ingram, Arvil Chaco, RN ?2:24 PM ? ? ? ? ?

## 2021-08-12 NOTE — Progress Notes (Unsigned)
Patient came by office today wanting to find out results of US done 07/14/21. I informed her with Interpreter Raquel Mora [place] basically normal except 2.3 cm right benign right ovary cyst, no follow up recommended per radiology. I also informed her she has [place] scheduled 5/ 18/23. She was unaware. I informed her she would need to be reexamined if having pelvic pain again, she denies any at present. Will send message to Dr. Ernestina Patches to see if she any other instructions for patient.  ?Also c/o thick white vaginal discharge, would like to be treated for yeast again. Diflucan RX sent in per protocol  ?Staci Acosta ?

## 2021-08-12 NOTE — Telephone Encounter (Signed)
Per message from Dr. Ernestina Patches [place] ordered for 10/09/21 should be cancelled as last US done 07/12/21 was negative and no follow up needed. I called patient with [place] Interpreter # [phone] and notified her [place] for May was cancelled because not needed. I again informed her if pain returns, she needs to make appointment to be evaluated again. ?Staci Acosta ?

## 2021-08-14 ENCOUNTER — Other Ambulatory Visit: Payer: Self-pay | Admitting: Obstetrics and Gynecology

## 2021-08-14 DIAGNOSIS — R928 Other abnormal and inconclusive findings on diagnostic imaging of breast: Secondary | ICD-10-CM

## 2021-09-16 ENCOUNTER — Other Ambulatory Visit: Payer: Self-pay | Admitting: Obstetrics and Gynecology

## 2021-09-16 ENCOUNTER — Ambulatory Visit
Admission: RE | Admit: 2021-09-16 | Discharge: 2021-09-16 | Disposition: A | Discharge status: Home / Self Care | Payer: No Typology Code available for payment source | Source: Ambulatory Visit | Attending: Obstetrics and Gynecology | Admitting: Obstetrics and Gynecology

## 2021-09-16 DIAGNOSIS — R928 Other abnormal and inconclusive findings on diagnostic imaging of breast: Secondary | ICD-10-CM

## 2021-09-16 DIAGNOSIS — N6489 Other specified disorders of breast: Secondary | ICD-10-CM

## 2021-10-02 ENCOUNTER — Telehealth: Payer: Self-pay | Admitting: Family Medicine

## 2021-10-02 DIAGNOSIS — B379 Candidiasis, unspecified: Secondary | ICD-10-CM

## 2021-10-02 MED ORDER — FLUCONAZOLE 150 MG PO TABS: 150.0000 mg | ORAL_TABLET | Freq: Once | ORAL | 0 refills | Status: AC

## 2021-10-02 NOTE — Telephone Encounter (Signed)
Patient requesting a RX for a vaginal inf, state she had the medication in the past.  ?

## 2021-10-02 NOTE — Telephone Encounter (Signed)
Called patient with Eda assisting with spanish interpretation and asked patient what symptoms she is having. She reports thick white discharge with a lot of itching. Informed patient of Rx sent to pharmacy for a yeast infection. Advised she call us back on Monday if she isn't feeling better. Patient verbalized understanding.  ?

## 2021-10-09 ENCOUNTER — Other Ambulatory Visit: Payer: Self-pay

## 2022-03-23 ENCOUNTER — Ambulatory Visit
Admission: RE | Admit: 2022-03-23 | Discharge: 2022-03-23 | Disposition: A | Discharge status: Home / Self Care | Payer: No Typology Code available for payment source | Source: Ambulatory Visit | Attending: Obstetrics and Gynecology | Admitting: Obstetrics and Gynecology

## 2022-03-23 ENCOUNTER — Other Ambulatory Visit: Payer: Self-pay | Admitting: Obstetrics and Gynecology

## 2022-03-23 DIAGNOSIS — N6489 Other specified disorders of breast: Secondary | ICD-10-CM

## 2022-04-28 ENCOUNTER — Telehealth: Payer: Self-pay | Admitting: General Practice

## 2022-04-28 NOTE — Telephone Encounter (Signed)
Patient called and left message in spanish requesting a call back.   Called patient with Rosemarie Ax assisting with spanish interpretation, no answer- left message to call us back if she still needs assistance.

## 2022-06-30 ENCOUNTER — Other Ambulatory Visit: Payer: Self-pay | Admitting: Family Medicine

## 2022-06-30 DIAGNOSIS — Z30011 Encounter for initial prescription of contraceptive pills: Secondary | ICD-10-CM

## 2022-07-23 ENCOUNTER — Other Ambulatory Visit: Payer: Self-pay

## 2022-07-23 ENCOUNTER — Telehealth: Payer: Self-pay

## 2022-07-23 ENCOUNTER — Telehealth: Payer: Self-pay | Admitting: General Practice

## 2022-07-23 DIAGNOSIS — N6489 Other specified disorders of breast: Secondary | ICD-10-CM

## 2022-07-23 NOTE — Telephone Encounter (Signed)
Called patient with Catherine Ingram assisting with spanish interpretation and informed her of refills available at pharmacy but need for follow up appt in office for additional refills. Patient verbalized understanding and would like an appt because she has been having RLQ pain. Patient also asked about need for pap smear. Told patient someone from [facility] will reach out to her with an appt for that. Patient verbalized understanding.

## 2022-07-23 NOTE — Telephone Encounter (Signed)
Telephoned patient using language line interpreter# [id number]. Left a voice message with BCCCP contact information.

## 2022-07-24 ENCOUNTER — Telehealth: Payer: Self-pay | Admitting: Family Medicine

## 2022-07-24 NOTE — Telephone Encounter (Signed)
Called patient with the help of interpreter Rosemarie Ax, there was no answer to the phone call and the option to leave a voicemail was unavailable.

## 2022-07-27 NOTE — Telephone Encounter (Signed)
Left a voice message using interpreter#[id number], BCCCP (scholarship).

## 2022-08-27 ENCOUNTER — Ambulatory Visit: Payer: Self-pay | Admitting: Obstetrics and Gynecology

## 2022-09-08 ENCOUNTER — Encounter: Payer: Self-pay | Admitting: Obstetrics and Gynecology

## 2022-09-08 ENCOUNTER — Other Ambulatory Visit: Payer: Self-pay

## 2022-09-08 ENCOUNTER — Ambulatory Visit (INDEPENDENT_AMBULATORY_CARE_PROVIDER_SITE_OTHER): Payer: Self-pay | Admitting: Obstetrics and Gynecology

## 2022-09-08 ENCOUNTER — Other Ambulatory Visit (HOSPITAL_COMMUNITY)
Admission: RE | Admit: 2022-09-08 | Discharge: 2022-09-08 | Disposition: A | Discharge status: Home / Self Care | Payer: Self-pay | Source: Ambulatory Visit | Attending: Obstetrics and Gynecology | Admitting: Obstetrics and Gynecology

## 2022-09-08 VITALS — BP 136/91 | HR 68 | Wt 187.5 lb

## 2022-09-08 DIAGNOSIS — Z758 Other problems related to medical facilities and other health care: Secondary | ICD-10-CM

## 2022-09-08 DIAGNOSIS — N898 Other specified noninflammatory disorders of vagina: Secondary | ICD-10-CM

## 2022-09-08 DIAGNOSIS — Z603 Acculturation difficulty: Secondary | ICD-10-CM

## 2022-09-08 DIAGNOSIS — R102 Pelvic and perineal pain: Secondary | ICD-10-CM

## 2022-09-08 NOTE — Progress Notes (Signed)
GYNECOLOGY VISIT  Patient name: Catherine Ingram MRN [id number]  Date of birth: 11-16-1975 Chief Complaint:   Gynecologic Exam   History:  Catherine Ingram is a 47 y.o. [phone] being seen today for pelvic pain. Intercourse will cause the pain, during zumba, will feel something heavy in the aboden. If she lifts something heavy will feel the pain. Points to RLQ for pain. Pain is "hot". Danielle Dess will last for a few hours and then will take the medication and it helps. No nausea with the pain. Abnormal discharge with the pain and  . Given a pills for gungus and refil for OCP. Pain like this , treated with naproxen but had not mentioned a cyst  Right sided pelvic pain x1 year not improved with naproxen. Reports taking apill so that cyst doesn't get bigger. Currently pills. Nw having irregular menses for the last few months - can be heavy or light. More painful on her menses.   Past Medical History:  Diagnosis Date   Anemia    Depression    Headache    Hypertension    Kidney stones     Past Surgical History:  Procedure Laterality Date   APPENDECTOMY     2015   DILATATION & CURETTAGE/HYSTEROSCOPY WITH MYOSURE N/A 08/12/2017   Procedure: DILATATION & CURETTAGE/HYSTEROSCOPY WITH MYOSURE;  Surgeon: Allie Bossier, MD;  Location: [facility];  Service: Gynecology;  Laterality: N/A;   LAPAROSCOPIC LYSIS OF ADHESIONS N/A 08/12/2017   Procedure: LAPAROSCOPIC LYSIS OF ADHESIONS;  Surgeon: Allie Bossier, MD;  Location: [facility];  Service: Gynecology;  Laterality: N/A;   LAPAROSCOPY N/A 08/12/2017   Procedure: LAPAROSCOPY DIAGNOSTIC WITH PERITONEAL BIOPSIES;  Surgeon: Allie Bossier, MD;  Location: [facility];  Service: Gynecology;  Laterality: N/A;   MYOMECTOMY N/A 06/04/2015   Procedure: MYOMECTOMY;  Surgeon: Allie Bossier, MD;  Location: [facility];  Service: Gynecology;  Laterality: N/A;   OVARIAN CYST REMOVAL     WISDOM TOOTH EXTRACTION  01/2013    The following portions of the patient's history  were reviewed and updated as appropriate: allergies, current medications, past family history, past medical history, past social history, past surgical history and problem list.   Health Maintenance:   Last pap     Component Value Date/Time   DIAGPAP  09/17/2017 0000    NEGATIVE FOR INTRAEPITHELIAL LESIONS OR MALIGNANCY.   ADEQPAP  09/17/2017 0000    Satisfactory for evaluation  endocervical/transformation zone component PRESENT.   Last mammogram: n/a   Review of Systems:  Pertinent items are noted in HPI. Comprehensive review of systems was otherwise negative.   Objective:  Physical Exam BP (!) 136/91   Pulse 68   Wt 187 lb 8 oz (85 kg)   LMP 09/04/2022   BMI 29.37 kg/m    Physical Exam Vitals and nursing note reviewed. Exam conducted with a chaperone present.  Constitutional:      Appearance: Normal appearance.  HENT:     Head: Normocephalic and atraumatic.  Pulmonary:     Effort: Pulmonary effort is normal.     Breath sounds: Normal breath sounds.  Abdominal:     Comments: + carnett in RLQ Non-tender LLQ  Genitourinary:    General: Normal vulva.     Exam position: Lithotomy position.     Vagina: Normal.     Cervix: Normal.     Comments: Normal appearing vulva Normal vulvar sensation bilaterally Nontender superficial pelvic floor muscles Nontender ischial tuberosities bilaterally  Allodynia at introitus: Yes: 6 oclock  Anal wink present Posterior vaginal wall nontender Right levator ani 2/10 Right ischiococcygeous 2/10 Right obturator internus 2/10 Left levator ani 2/10 Left ischioccocygeous 2/10 Left obturator internus 2/10 Anterior vaginal wall nontender Uterine tenderness tender Right adnexal tenderness, nontender left adnexa Pain w/ pelvic floor palpation different from lower abdominal pain Mild CMT   Skin:    General: Skin is warm and dry.  Neurological:     General: No focal deficit present.     Mental Status: She is alert.  Psychiatric:         Mood and Affect: Mood normal.        Behavior: Behavior normal.        Thought Content: Thought content normal.        Judgment: Judgment normal.       Assessment & Plan:   1. Female pelvic pain Pelvic US to assess for ovarian pathology and interval changes. Noted to have right adnexal tenderness - prior US shows small benign ovarian cyst. Pain in part due to MSK source as well.  - US PELVIC COMPLETE WITH TRANSVAGINAL; Future  2. Vaginal discharge Vaginitis swab collected - Cervicovaginal ancillary only  3. Language barrier In person spanish interpreter    Lorriane Shire, MD Minimally Invasive Gynecologic Surgery Center for [facility], [facility]

## 2022-09-09 ENCOUNTER — Other Ambulatory Visit: Payer: Self-pay | Admitting: Obstetrics and Gynecology

## 2022-09-09 DIAGNOSIS — B3731 Acute candidiasis of vulva and vagina: Secondary | ICD-10-CM

## 2022-09-09 LAB — CERVICOVAGINAL ANCILLARY ONLY
Bacterial Vaginitis (gardnerella): NEGATIVE
Candida Glabrata: NEGATIVE
Candida Vaginitis: POSITIVE — AB
Comment: NEGATIVE
Comment: NEGATIVE
Comment: NEGATIVE

## 2022-09-09 MED ORDER — FLUCONAZOLE 150 MG PO TABS: 150.0000 mg | ORAL_TABLET | Freq: Once | ORAL | 3 refills | Status: DC

## 2022-09-10 ENCOUNTER — Telehealth: Payer: Self-pay

## 2022-09-10 NOTE — Telephone Encounter (Addendum)
-----   Message from Lorriane Shire, MD sent at 09/09/2022  6:18 PM EDT ----- Notify of yeast infection, rx sent to pharmacy   Called pt with Eda R., and left message to please call the office for results.   Leonette Nutting  09/10/22

## 2022-09-16 NOTE — Telephone Encounter (Signed)
Called pt with Spanish Interpreter Eda R., and informed pt results and treatment of Diflucan.  Pt reports that she has picked up the medication already.  I advised that there are refills and if needed to wait three days before taking another tablet.  Pt verbalized understanding.  Addison Naegeli, RN  09/16/22

## 2022-09-23 ENCOUNTER — Ambulatory Visit (HOSPITAL_COMMUNITY)
Admission: RE | Admit: 2022-09-23 | Discharge: 2022-09-23 | Disposition: A | Discharge status: Home / Self Care | Payer: Self-pay | Source: Ambulatory Visit | Attending: Obstetrics and Gynecology | Admitting: Obstetrics and Gynecology

## 2022-09-23 DIAGNOSIS — R102 Pelvic and perineal pain: Secondary | ICD-10-CM | POA: Insufficient documentation

## 2022-09-24 ENCOUNTER — Ambulatory Visit (HOSPITAL_COMMUNITY): Payer: Self-pay

## 2022-09-24 ENCOUNTER — Other Ambulatory Visit: Payer: Self-pay | Admitting: Obstetrics and Gynecology

## 2022-09-28 ENCOUNTER — Telehealth: Payer: Self-pay | Admitting: Family Medicine

## 2022-09-28 ENCOUNTER — Telehealth: Payer: Self-pay

## 2022-09-28 DIAGNOSIS — R102 Pelvic and perineal pain: Secondary | ICD-10-CM

## 2022-09-28 NOTE — Addendum Note (Signed)
Addended by: Faythe Casa on: 09/28/2022 03:45 PM   Modules accepted: Orders

## 2022-09-28 NOTE — Telephone Encounter (Signed)
Pt notified of results and pt agrees to PT.  PT referral placed and pt informed that someone will call her to schedule the appt in about a week.  Pt verbalized understanding,   Demerius Podolak,RN  09/28/22

## 2022-09-28 NOTE — Telephone Encounter (Addendum)
-----   Message from Lorriane Shire, MD sent at 09/24/2022  8:52 AM EDT ----- Notify that ultrasound shows normal ovaries. May have very small fibroids but otherwise normal ultrasound. Recommend trial of pelvic physical therapy and consider IUD for irregular menses. If she is open to pelvic physical therapy, will place referral   Called pt with interpreter Debarah Crape; VM left stating I am calling with results and provider recommendation.

## 2022-09-28 NOTE — Telephone Encounter (Signed)
Patient would like a call back...missed call regarding results

## 2022-10-26 ENCOUNTER — Other Ambulatory Visit: Payer: Self-pay | Admitting: Family Medicine

## 2022-10-26 DIAGNOSIS — Z30011 Encounter for initial prescription of contraceptive pills: Secondary | ICD-10-CM

## 2022-11-04 ENCOUNTER — Other Ambulatory Visit: Payer: Self-pay | Admitting: Lactation Services

## 2022-11-04 DIAGNOSIS — Z30011 Encounter for initial prescription of contraceptive pills: Secondary | ICD-10-CM

## 2022-11-05 MED ORDER — NORETHINDRONE 0.35 MG PO TABS: 1.0000 | ORAL_TABLET | Freq: Every day | ORAL | 11 refills | Status: DC

## 2022-11-06 ENCOUNTER — Telehealth: Payer: Self-pay

## 2022-11-06 NOTE — Telephone Encounter (Signed)
Telephoned patient at mobile number using interpreter#[phone]. Left a voice message with BCCCP contact information.

## 2022-11-13 ENCOUNTER — Telehealth: Payer: Self-pay

## 2022-11-13 NOTE — Telephone Encounter (Signed)
Telephoned patient at mobile number using interpreter#[id number]. Left a voice message with BCCCP contact information.

## 2023-02-27 ENCOUNTER — Other Ambulatory Visit: Payer: Self-pay | Admitting: Obstetrics and Gynecology

## 2023-02-27 DIAGNOSIS — B3731 Acute candidiasis of vulva and vagina: Secondary | ICD-10-CM

## 2023-03-24 NOTE — Telephone Encounter (Signed)
Open in error

## 2023-04-05 ENCOUNTER — Other Ambulatory Visit: Payer: Self-pay | Admitting: Obstetrics and Gynecology

## 2023-04-05 DIAGNOSIS — B3731 Acute candidiasis of vulva and vagina: Secondary | ICD-10-CM

## 2023-05-08 ENCOUNTER — Other Ambulatory Visit: Payer: Self-pay | Admitting: Obstetrics and Gynecology

## 2023-05-08 DIAGNOSIS — B3731 Acute candidiasis of vulva and vagina: Secondary | ICD-10-CM

## 2023-05-23 IMAGING — US US BREAST*R* LIMITED INC AXILLA
1 series · 6 of 6 positions shown · non-contrast
Comparison: Previous exam(s).

CLINICAL DATA: Patient recalled from screening for right breast
asymmetry.

EXAM:
DIGITAL DIAGNOSTIC UNILATERAL RIGHT MAMMOGRAM WITH TOMOSYNTHESIS AND
CAD; ULTRASOUND RIGHT BREAST LIMITED
TECHNIQUE: Right digital diagnostic mammography and breast tomosynthesis was
performed. The images were evaluated with computer-aided detection.;
Targeted ultrasound examination of the right breast was performed

[Series 1: us breast*right* limited inc axilla · 0.07mm/px · 6 of 6 slices shown]
[im 1/6]
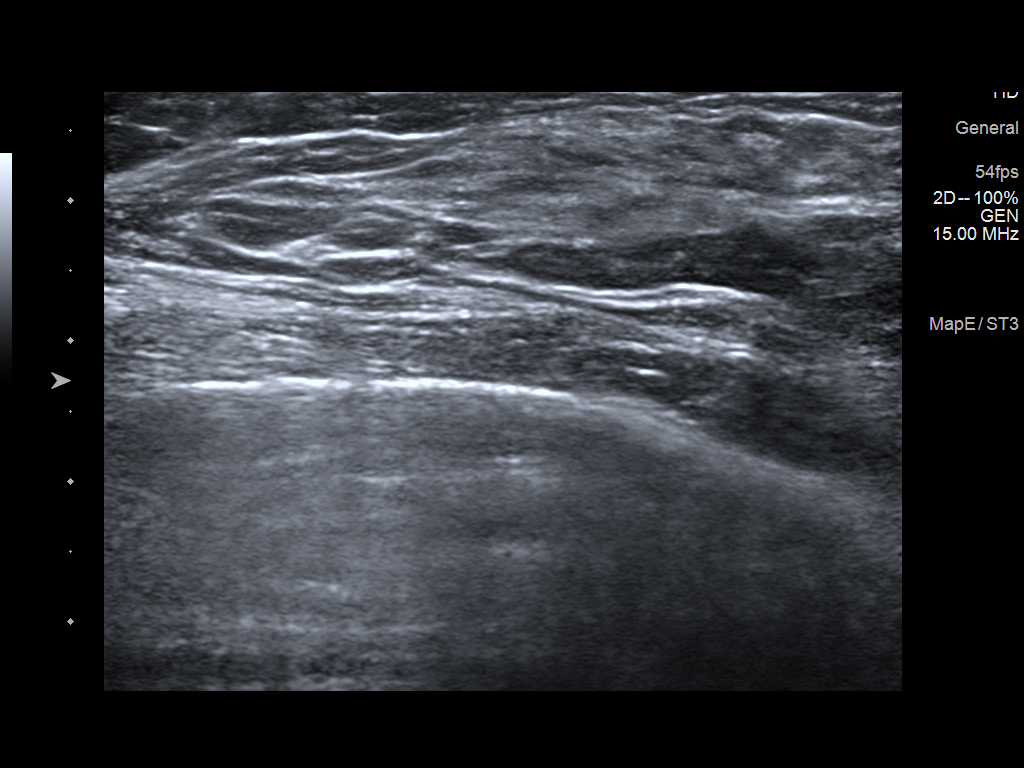
[im 2/6]
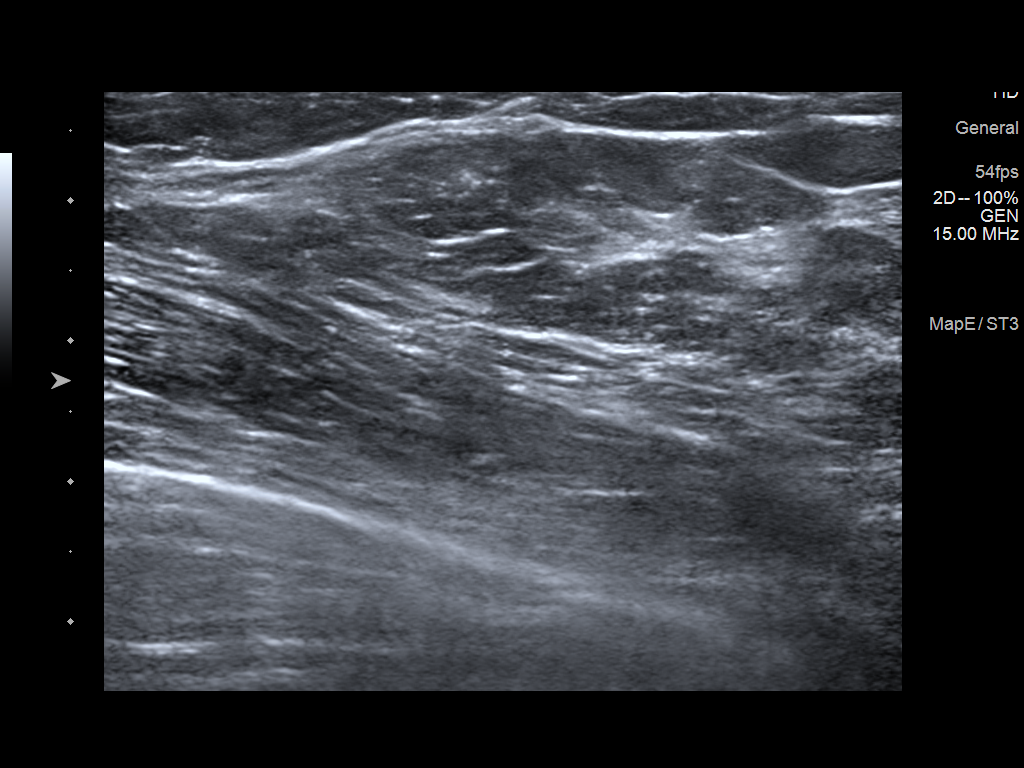
[im 3/6]
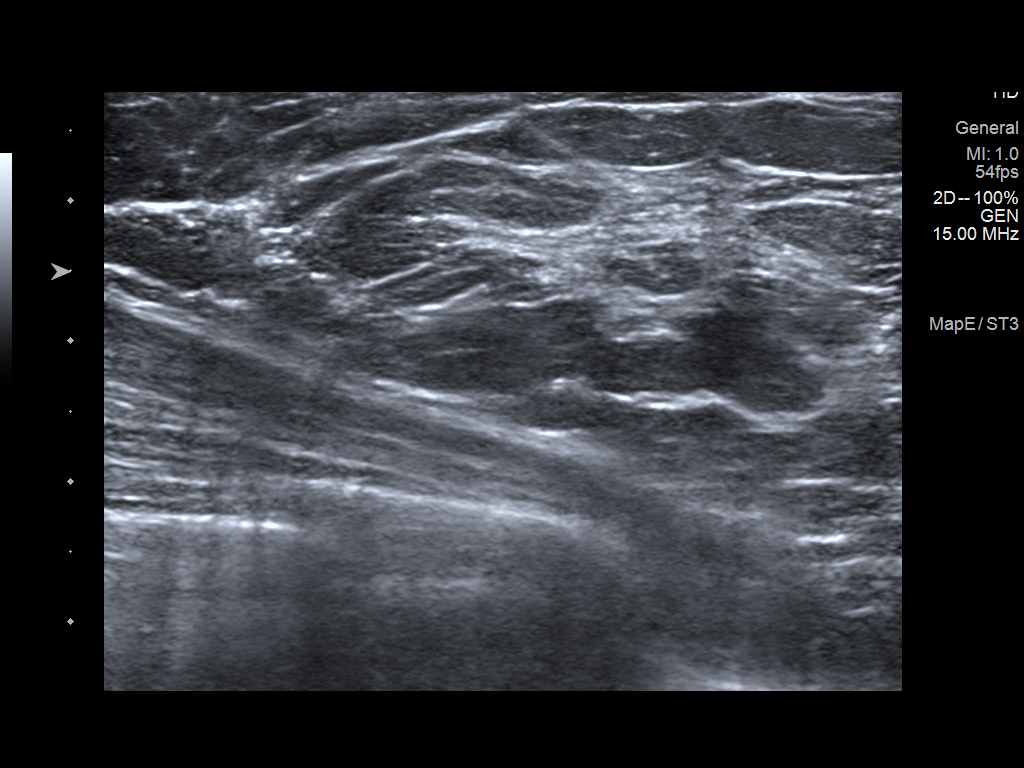
[im 4/6]
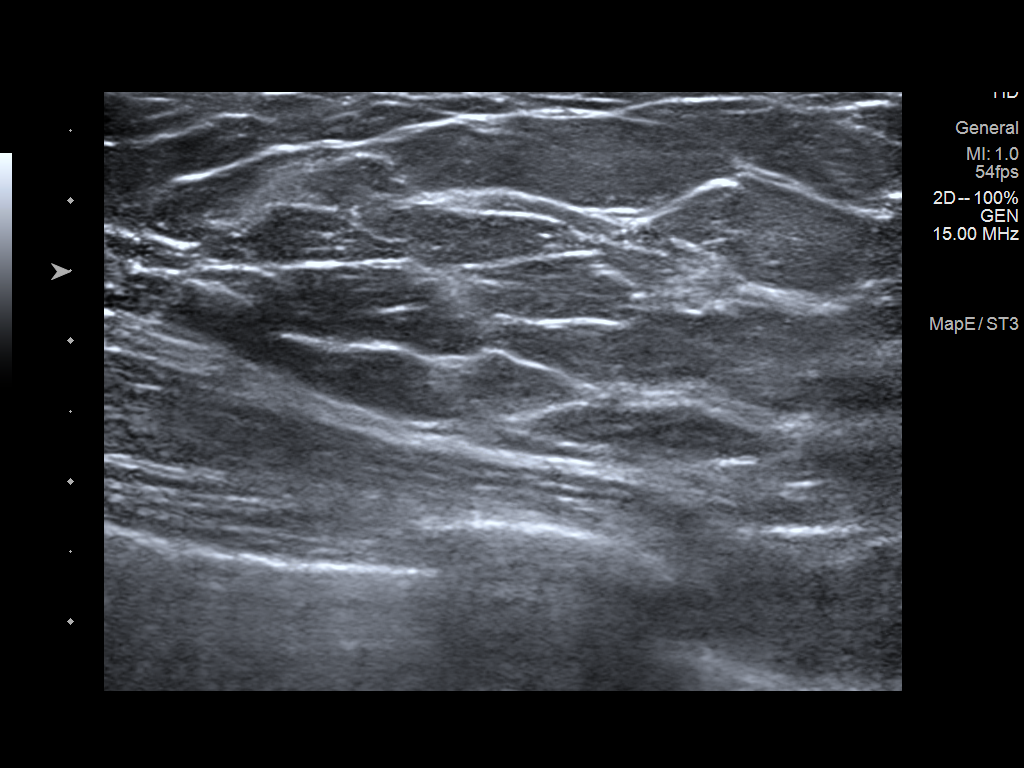
[im 5/6]
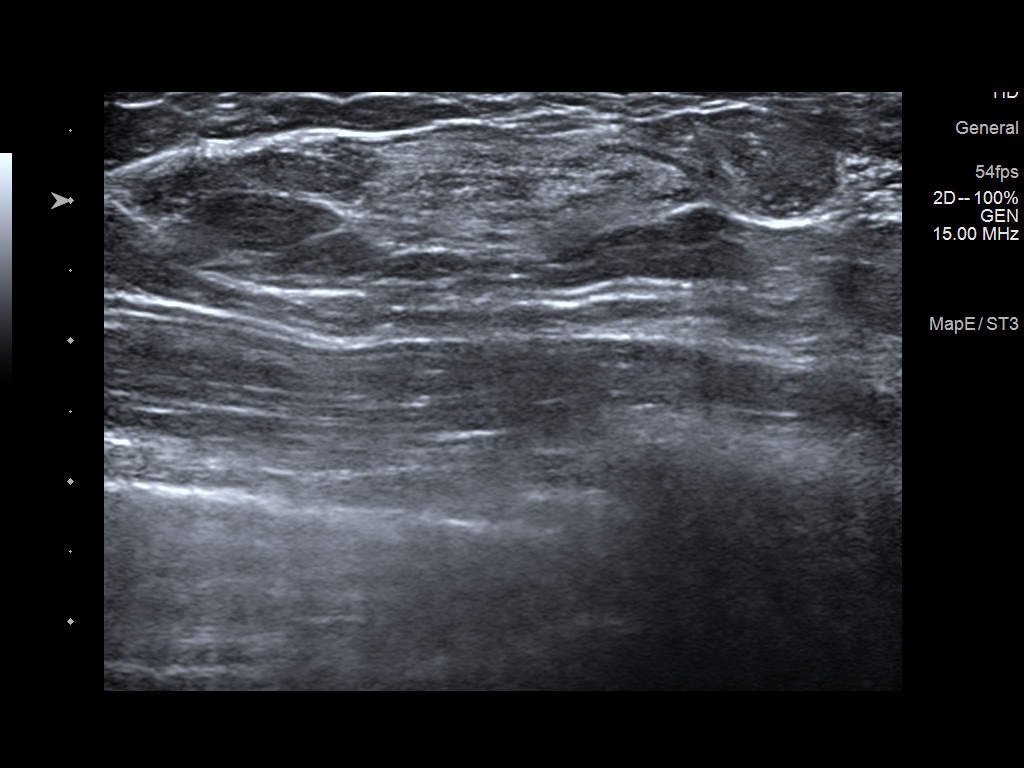
[im 6/6]
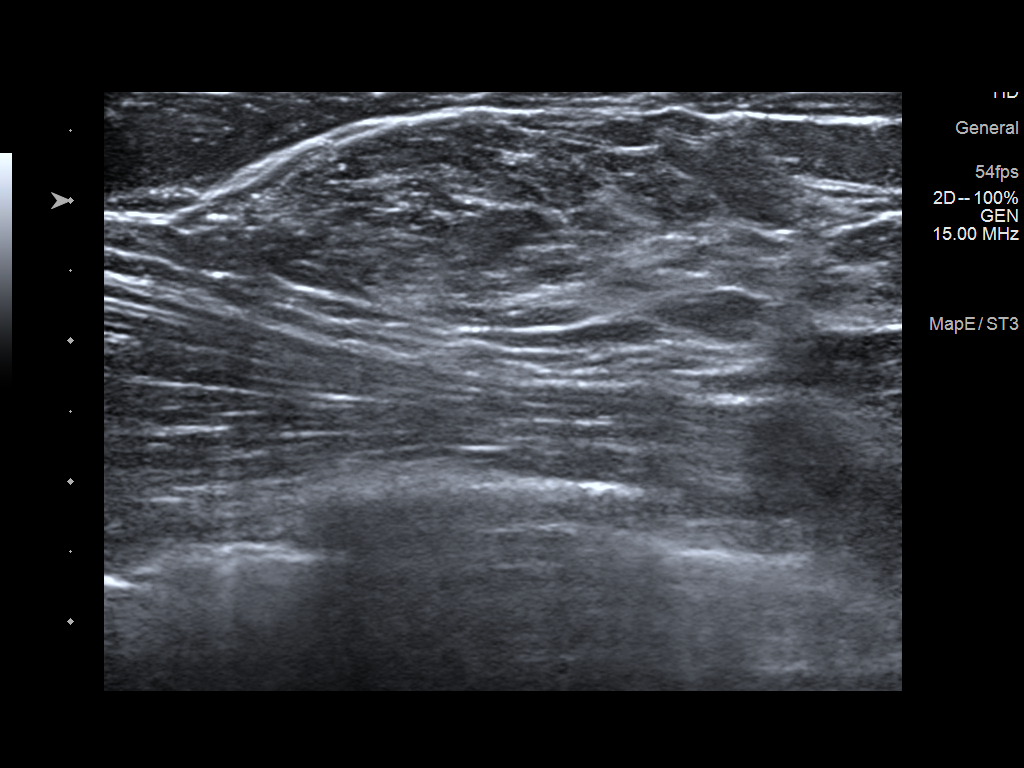

[6 of 6 positions shown; findings below may reference images not displayed]

ACR Breast Density Category c: The breast tissue is heterogeneously
dense, which may obscure small masses.
FINDINGS: Questioned asymmetry within the upper-outer right breast partially
effaced with additional imaging suggestive of dense fibroglandular
tissue.

Targeted ultrasound is performed, showing normal tissue without
suspicious mass within the upper-outer right breast felt to
correspond with the mammographic asymmetry.
IMPRESSION: Probably benign asymmetry upper outer right breast.

RECOMMENDATION:
Right breast diagnostic mammogram in 6 months to reassess probably
benign right breast asymmetry.

I have discussed the findings and recommendations with the patient.
If applicable, a reminder letter will be sent to the patient
regarding the next appointment.

BI-RADS CATEGORY  3: Probably benign.

## 2023-05-23 IMAGING — MG MM DIGITAL DIAGNOSTIC UNILAT*R* W/ TOMO W/ CAD
4 series · 4 of 12 positions shown · non-contrast
Comparison: Previous exam(s).

CLINICAL DATA: Patient recalled from screening for right breast
asymmetry.

EXAM:
DIGITAL DIAGNOSTIC UNILATERAL RIGHT MAMMOGRAM WITH TOMOSYNTHESIS AND
CAD; ULTRASOUND RIGHT BREAST LIMITED
TECHNIQUE: Right digital diagnostic mammography and breast tomosynthesis was
performed. The images were evaluated with computer-aided detection.;
Targeted ultrasound examination of the right breast was performed

[R CC synth-2D]
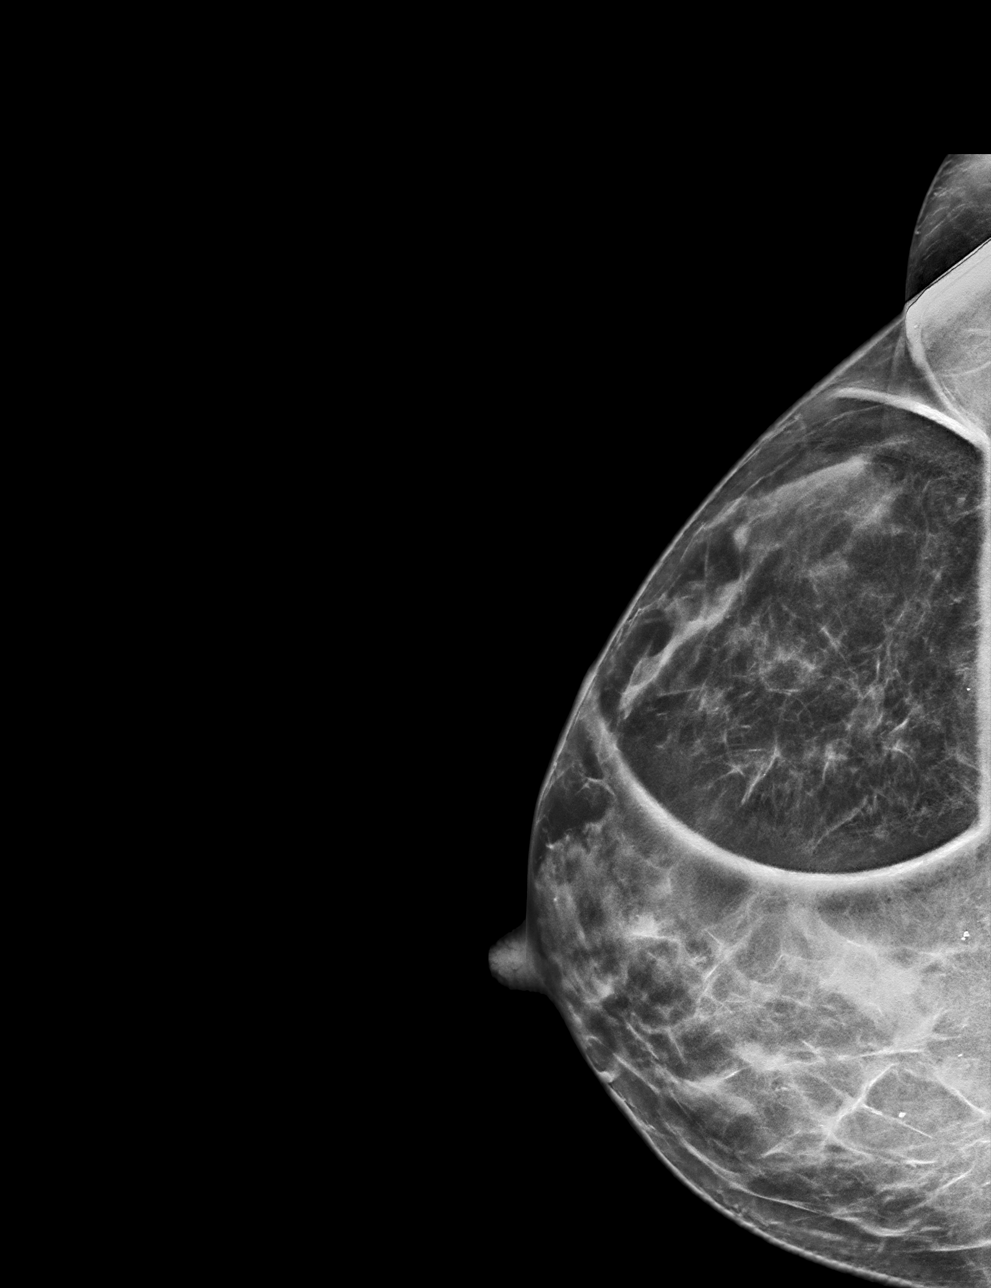

[R ML synth-2D]
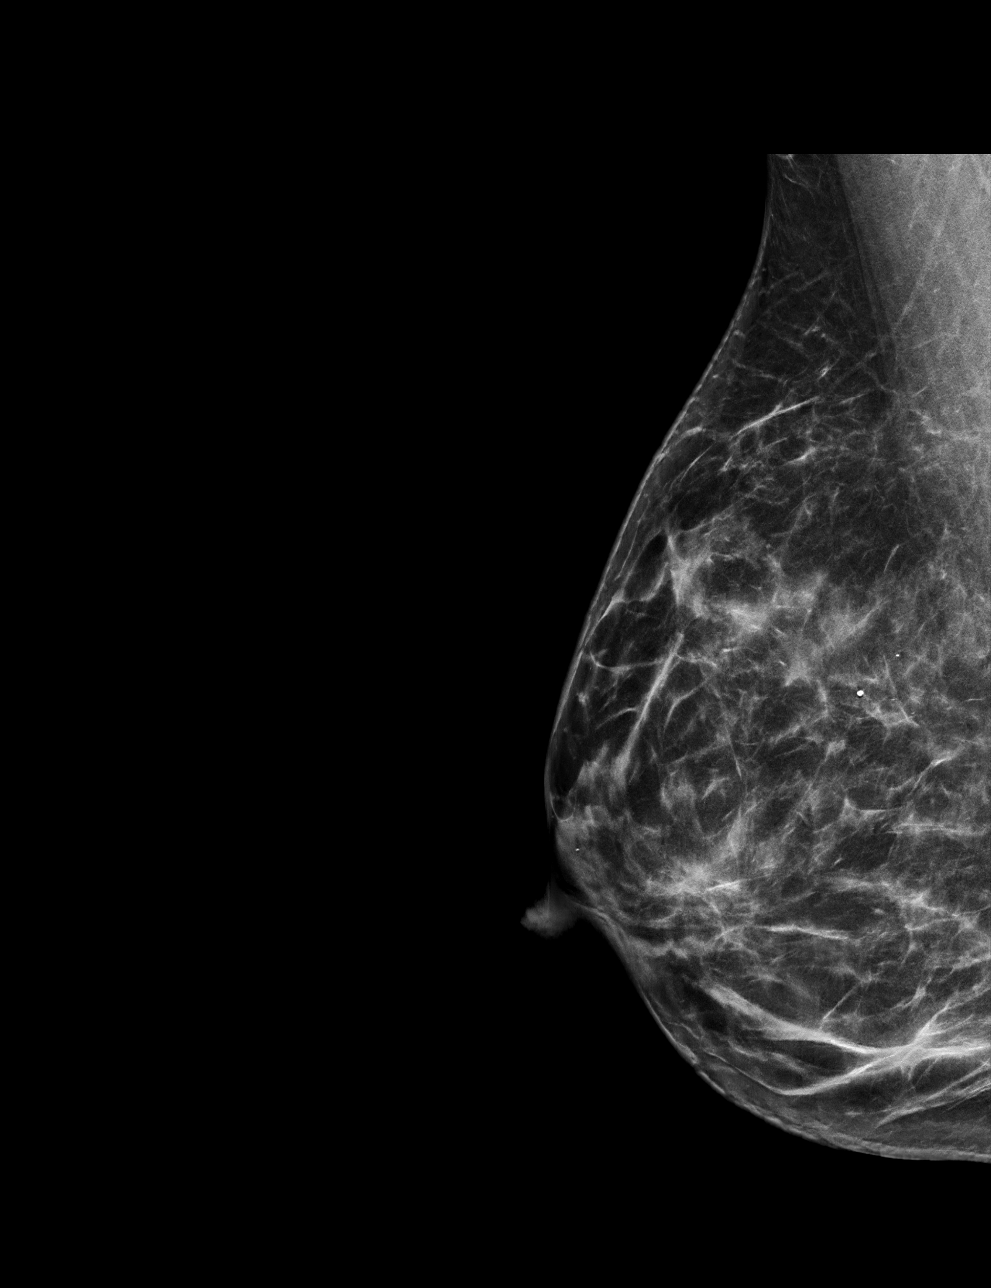

[R ML tomo · tomo slice 34/67.0]
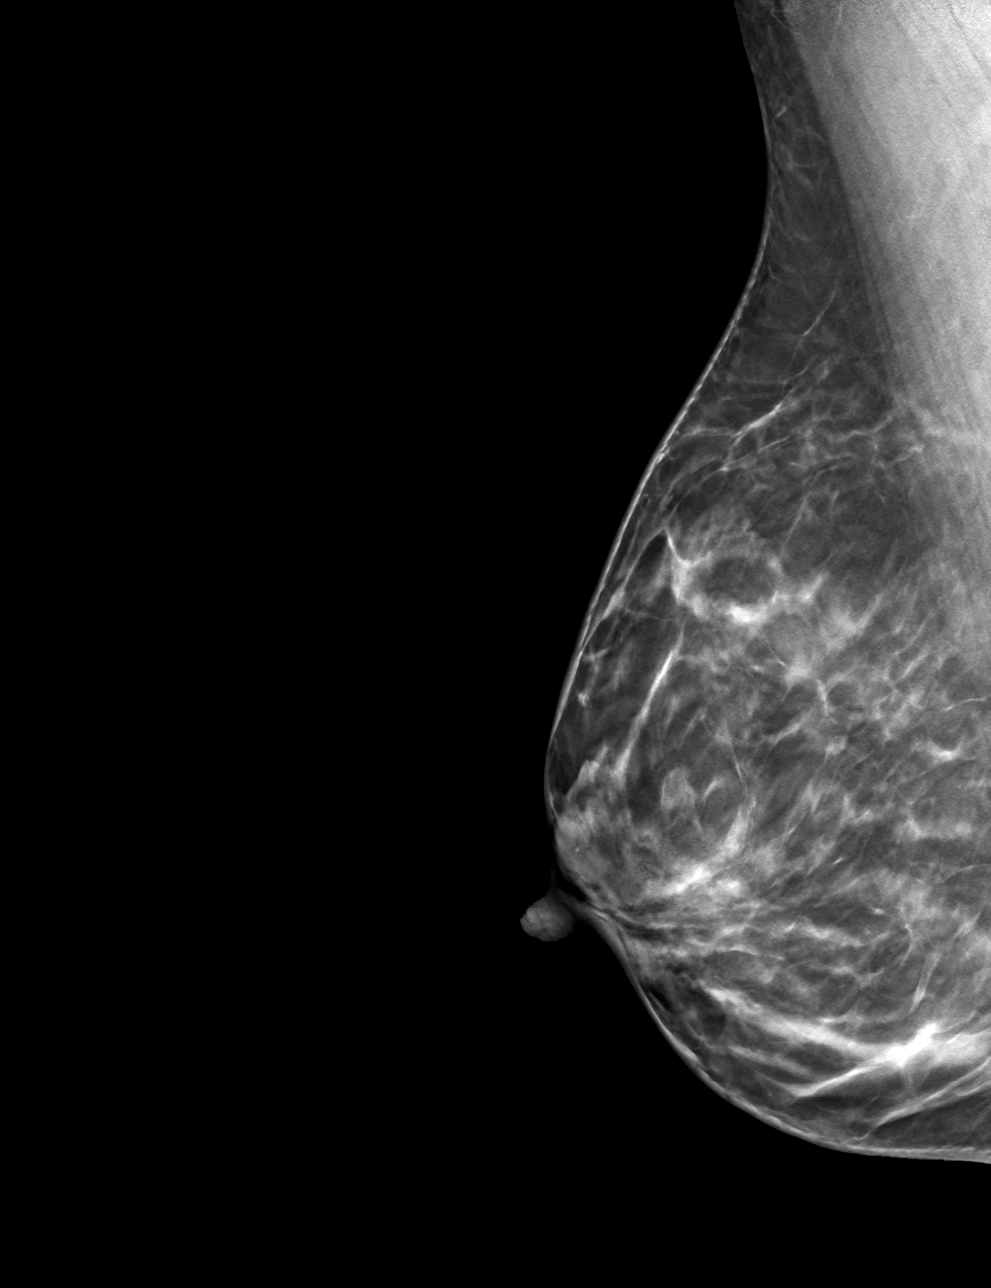

[R CC tomo · tomo slice 31/62.0]
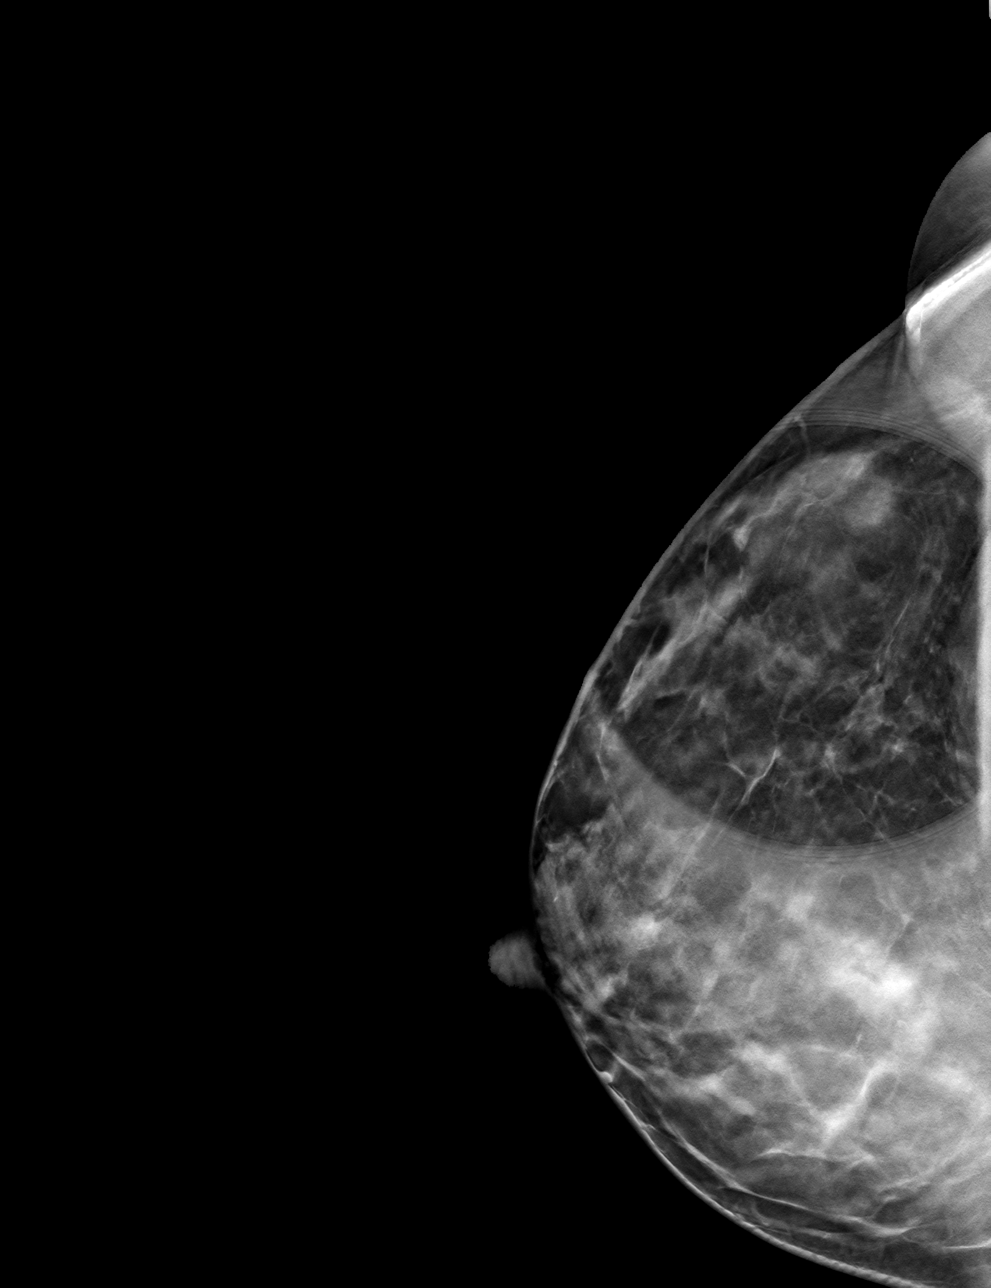

[4 of 12 positions shown; findings below may reference images not displayed]

ACR Breast Density Category c: The breast tissue is heterogeneously
dense, which may obscure small masses.
FINDINGS: Questioned asymmetry within the upper-outer right breast partially
effaced with additional imaging suggestive of dense fibroglandular
tissue.

Targeted ultrasound is performed, showing normal tissue without
suspicious mass within the upper-outer right breast felt to
correspond with the mammographic asymmetry.
IMPRESSION: Probably benign asymmetry upper outer right breast.

RECOMMENDATION:
Right breast diagnostic mammogram in 6 months to reassess probably
benign right breast asymmetry.

I have discussed the findings and recommendations with the patient.
If applicable, a reminder letter will be sent to the patient
regarding the next appointment.

BI-RADS CATEGORY  3: Probably benign.

## 2023-09-15 ENCOUNTER — Telehealth: Payer: Self-pay

## 2023-09-15 ENCOUNTER — Other Ambulatory Visit: Payer: Self-pay

## 2023-09-15 DIAGNOSIS — Z30011 Encounter for initial prescription of contraceptive pills: Secondary | ICD-10-CM

## 2023-09-15 MED ORDER — NORETHINDRONE 0.35 MG PO TABS: 1.0000 | ORAL_TABLET | Freq: Every day | ORAL | 3 refills | Status: DC

## 2023-09-15 NOTE — Telephone Encounter (Signed)
 Called patient in regarding VM she left on RN line about her [facility] refill. Patient reports that she is out of her BC pills and needs refills. Reviewed with patient we sent in 1 year supply of BC and is due for an annual. Advised patient to call our front office to schedule an annual and can refill her birth control pills for 3 months in the meantime. Patient verbalized understanding.  This called used in house Spanish interpreter Eda during this call.   Marthann Slade

## 2024-01-25 ENCOUNTER — Other Ambulatory Visit: Payer: Self-pay | Admitting: Obstetrics and Gynecology

## 2024-01-25 DIAGNOSIS — Z30011 Encounter for initial prescription of contraceptive pills: Secondary | ICD-10-CM

## 2024-02-24 ENCOUNTER — Other Ambulatory Visit: Payer: Self-pay | Admitting: Obstetrics and Gynecology

## 2024-02-24 DIAGNOSIS — Z30011 Encounter for initial prescription of contraceptive pills: Secondary | ICD-10-CM
